# Patient Record
Sex: Male | Born: 1980 | Race: Black or African American | Hispanic: No | Marital: Single | Smoking: Current every day smoker
Health system: Southern US, Community
[De-identification: ages and names within clinical notes are randomized; demographics above are authoritative.]

## PROBLEM LIST (undated history)

## (undated) DIAGNOSIS — N200 Calculus of kidney: Secondary | ICD-10-CM

## (undated) DIAGNOSIS — I10 Essential (primary) hypertension: Secondary | ICD-10-CM

---

## 2008-09-26 ENCOUNTER — Emergency Department: Payer: Self-pay | Admitting: Emergency Medicine

## 2012-03-04 ENCOUNTER — Emergency Department: Payer: Self-pay | Admitting: Emergency Medicine

## 2012-03-04 LAB — COMPREHENSIVE METABOLIC PANEL
Albumin: 4 g/dL (ref 3.4–5.0)
Alkaline Phosphatase: 114 U/L (ref 50–136)
Anion Gap: 11 (ref 7–16)
BUN: 11 mg/dL (ref 7–18)
Calcium, Total: 9.4 mg/dL (ref 8.5–10.1)
Creatinine: 1 mg/dL (ref 0.60–1.30)
Glucose: 115 mg/dL — ABNORMAL HIGH (ref 65–99)
Osmolality: 282 (ref 275–301)
Potassium: 3.9 mmol/L (ref 3.5–5.1)
SGOT(AST): 38 U/L — ABNORMAL HIGH (ref 15–37)
Sodium: 141 mmol/L (ref 136–145)
Total Protein: 7.6 g/dL (ref 6.4–8.2)

## 2012-03-04 LAB — URINALYSIS, COMPLETE
Bilirubin,UR: NEGATIVE
Glucose,UR: NEGATIVE mg/dL (ref 0–75)
Ketone: NEGATIVE
Leukocyte Esterase: NEGATIVE
Protein: 100
RBC,UR: 1055 /HPF (ref 0–5)
Squamous Epithelial: 1
WBC UR: 12 /HPF (ref 0–5)

## 2012-03-04 LAB — CBC
HCT: 41.5 % (ref 40.0–52.0)
HGB: 13.7 g/dL (ref 13.0–18.0)
MCH: 27.8 pg (ref 26.0–34.0)
MCHC: 33 g/dL (ref 32.0–36.0)
Platelet: 224 10*3/uL (ref 150–440)
RDW: 14.7 % — ABNORMAL HIGH (ref 11.5–14.5)

## 2014-12-29 ENCOUNTER — Encounter: Payer: Self-pay | Admitting: Intensive Care

## 2014-12-29 ENCOUNTER — Emergency Department
Admission: EM | Admit: 2014-12-29 | Discharge: 2014-12-29 | Disposition: A | Discharge status: Home / Self Care | Payer: Self-pay | Attending: Emergency Medicine | Admitting: Emergency Medicine

## 2014-12-29 DIAGNOSIS — Z720 Tobacco use: Secondary | ICD-10-CM | POA: Insufficient documentation

## 2014-12-29 DIAGNOSIS — I10 Essential (primary) hypertension: Secondary | ICD-10-CM | POA: Insufficient documentation

## 2014-12-29 DIAGNOSIS — H65191 Other acute nonsuppurative otitis media, right ear: Secondary | ICD-10-CM | POA: Insufficient documentation

## 2014-12-29 DIAGNOSIS — H6121 Impacted cerumen, right ear: Secondary | ICD-10-CM | POA: Insufficient documentation

## 2014-12-29 HISTORY — DX: Essential (primary) hypertension: I10

## 2014-12-29 MED ORDER — IBUPROFEN 800 MG PO TABS
800.0000 mg | ORAL_TABLET | Freq: Three times a day (TID) | ORAL | Status: DC | PRN
Start: 2014-12-29 — End: 2016-03-09

## 2014-12-29 MED ORDER — CARBAMIDE PEROXIDE 6.5 % OT SOLN
5.0000 [drp] | Freq: Once | OTIC | Status: AC
  Administered 2014-12-29: 5 [drp] via OTIC
  Filled 2014-12-29: qty 15

## 2014-12-29 MED ORDER — AZITHROMYCIN 250 MG PO TABS: ORAL_TABLET | ORAL | Status: DC

## 2014-12-29 NOTE — ED Notes (Signed)
Ear  Irrigation performed with warm water and hydrogen peroxide

## 2014-12-29 NOTE — ED Notes (Signed)
Patient presents to ED with c/o R ear pain and throat pain X 1 day. NAD noted. Patient ambuated to rm with NAD

## 2014-12-29 NOTE — ED Provider Notes (Signed)
[facility] Emergency Department Provider Note  ____________________________________________  Time seen: Approximately 9:21 AM  I have reviewed the triage vital signs and the nursing notes.   HISTORY  Chief Complaint Ear Pain    HPI Guy BanJames Shoemaker Jr. is a 34 y.o. male resents for evaluation of right ear pain. Patient states his years been blocked up and he can't hear out of his ear. Denies any problems complaints with his left ear.   Past Medical History  Diagnosis Date  . Hypertension     There are no active problems to display for this patient.   History reviewed. No pertinent past surgical history.  Current Outpatient Rx  Name  Route  Sig  Dispense  Refill  . azithromycin (ZITHROMAX Z-PAK) 250 MG tablet      Take 2 tablets (500 mg) on  Day 1,  followed by 1 tablet (250 mg) once daily on Days 2 through 5.   6 each   0   . ibuprofen (ADVIL,MOTRIN) 800 MG tablet   Oral   Take 1 tablet (800 mg total) by mouth every 8 (eight) hours as needed.   30 tablet   0     Allergies Review of patient's allergies indicates no known allergies.  History reviewed. No pertinent family history.  Social History Social History  Substance Use Topics  . Smoking status: Current Every Day Smoker -- 0.50 packs/day    Types: Cigarettes  . Smokeless tobacco: Never Used  . Alcohol Use: No    Review of Systems Constitutional: No fever/chills Eyes: No visual changes. ENT: No sore throat. Positive right ear pain and decreased hearing. Cardiovascular: Denies chest pain. Respiratory: Denies shortness of breath. Gastrointestinal: No abdominal pain.  No nausea, no vomiting.  No diarrhea.  No constipation. Genitourinary: Negative for dysuria. Musculoskeletal: Negative for back pain. Skin: Negative for rash. Neurological: Negative for headaches, focal weakness or numbness.  10-point ROS otherwise  negative.  ____________________________________________   PHYSICAL EXAM:  VITAL SIGNS: ED Triage Vitals  Enc Vitals Group     BP 12/29/14 0915 172/108 mmHg     Pulse Rate 12/29/14 0915 83     Resp 12/29/14 0915 20     Temp 12/29/14 0915 97.8 F (36.6 C)     Temp Source 12/29/14 0915 Oral     SpO2 12/29/14 0915 94 %     Weight 12/29/14 0915 483 lb (219.087 kg)     Height 12/29/14 0915 5\' 7"  (1.702 m)     Head Cir --      Peak Flow --      Pain Score 12/29/14 0916 5     Pain Loc --      Pain Edu? --      Excl. in GC? --     Constitutional: Alert and oriented. Well appearing and in no acute distress. Eyes: Conjunctivae are normal. PERRL. EOMI. Head: Atraumatic. Right ear with cerumen impaction. Left ear unremarkable with TMs normal. Nose: No congestion/rhinnorhea. Mouth/Throat: Mucous membranes are moist.  Oropharynx non-erythematous. Neck: No stridor.   Cardiovascular: Normal rate, regular rhythm. Grossly normal heart sounds.  Good peripheral circulation. Respiratory: Normal respiratory effort.  No retractions. Lungs CTAB. Gastrointestinal: Soft and nontender. No distention. No abdominal bruits. No CVA tenderness. Musculoskeletal: No lower extremity tenderness nor edema.  No joint effusions. Neurologic:  Normal speech and language. No gross focal neurologic deficits are appreciated. No gait instability. Skin:  Skin is warm, dry and intact. No rash noted. Psychiatric: Mood  and affect are normal. Speech and behavior are normal.  ____________________________________________   LABS (all labs ordered are listed, but only abnormal results are displayed)  Labs Reviewed - No data to display ____________________________________________   PROCEDURES  Procedure(s) performed: None  Critical Care performed: No  ____________________________________________   INITIAL IMPRESSION / ASSESSMENT AND PLAN / ED COURSE  Pertinent labs & imaging results that were available during  my care of the patient were reviewed by me and considered in my medical decision making (see chart for details).  Cerumen impaction right ear. Resolved with irrigation. Acute otitis media right. Rx provided for Z-Pak, Motrin 800 mg 3 times a day. Patient follow-up with PCP or return to the ER with any worsening symptomology. ____________________________________________   FINAL CLINICAL IMPRESSION(S) / ED DIAGNOSES  Final diagnoses:  Other acute nonsuppurative otitis media of right ear  Cerumen impaction, right      Evangeline Dakin, PA-C 12/29/14 1045  Sharyn Creamer, MD 12/29/14 1546

## 2014-12-29 NOTE — Discharge Instructions (Signed)
Otitis Media, Adult °Otitis media is redness, soreness, and inflammation of the middle ear. Otitis media may be caused by allergies or, most commonly, by infection. Often it occurs as a complication of the common cold. °SIGNS AND SYMPTOMS °Symptoms of otitis media may include: °· Earache. °· Fever. °· Ringing in your ear. °· Headache. °· Leakage of fluid from the ear. °DIAGNOSIS °To diagnose otitis media, your health care provider will examine your ear with an otoscope. This is an instrument that allows your health care provider to see into your ear in order to examine your eardrum. Your health care provider also will ask you questions about your symptoms. °TREATMENT  °Typically, otitis media resolves on its own within 3-5 days. Your health care provider may prescribe medicine to ease your symptoms of pain. If otitis media does not resolve within 5 days or is recurrent, your health care provider may prescribe antibiotic medicines if he or she suspects that a bacterial infection is the cause. °HOME CARE INSTRUCTIONS  °· If you were prescribed an antibiotic medicine, finish it all even if you start to feel better. °· Take medicines only as directed by your health care provider. °· Keep all follow-up visits as directed by your health care provider. °SEEK MEDICAL CARE IF: °· You have otitis media only in one ear, or bleeding from your nose, or both. °· You notice a lump on your neck. °· You are not getting better in 3-5 days. °· You feel worse instead of better. °SEEK IMMEDIATE MEDICAL CARE IF:  °· You have pain that is not controlled with medicine. °· You have swelling, redness, or pain around your ear or stiffness in your neck. °· You notice that part of your face is paralyzed. °· You notice that the bone behind your ear (mastoid) is tender when you touch it. °MAKE SURE YOU:  °· Understand these instructions. °· Will watch your condition. °· Will get help right away if you are not doing well or get worse. °  °This  information is not intended to replace advice given to you by your health care provider. Make sure you discuss any questions you have with your health care provider. °  °Document Released: 11/30/2003 Document Revised: 03/17/2014 Document Reviewed: 09/21/2012 °Elsevier Interactive Patient Education ©2016 [employer] ° °Cerumen Impaction °The structures of the external ear canal secrete a waxy substance known as cerumen. Excess cerumen can build up in the ear canal, causing a condition known as cerumen impaction. Cerumen impaction can cause ear pain and disrupt the function of the ear. °The rate of cerumen production differs for each individual. In certain individuals, the configuration of the ear canal may decrease his or her ability to naturally remove cerumen. °CAUSES °Cerumen impaction is caused by excessive cerumen production or buildup. °RISK FACTORS °· Frequent use of swabs to clean ears. °· Having narrow ear canals. °· Having eczema. °· Being dehydrated. °SIGNS AND SYMPTOMS °· Diminished hearing. °· Ear drainage. °· Ear pain. °· Ear itch. °TREATMENT °Treatment may involve: °· Over-the-counter or prescription ear drops to soften the cerumen. °· Removal of cerumen by a health care provider. This may be done with: °¨ Irrigation with warm water. This is the most common method of removal. °¨ Ear curettes and other instruments. °¨ Surgery. This may be done in severe cases. °HOME CARE INSTRUCTIONS °· Take medicines only as directed by your health care provider. °· Do not insert objects into the ear with the intent of cleaning the ear. °PREVENTION °·   Do not insert objects into the ear, even with the intent of cleaning the ear. Removing cerumen as a part of normal hygiene is not necessary, and the use of swabs in the ear canal is not recommended. °· Drink enough water to keep your urine clear or pale yellow. °· Control your eczema if you have it. °SEEK MEDICAL CARE IF: °· You develop ear pain. °· You develop bleeding  from the ear. °· The cerumen does not clear after you use ear drops as directed. °  °This information is not intended to replace advice given to you by your health care provider. Make sure you discuss any questions you have with your health care provider. °  °Document Released: 04/03/2004 Document Revised: 03/17/2014 Document Reviewed: 10/11/2014 °Elsevier Interactive Patient Education ©2016 [employer]vier [employer] ° °

## 2016-03-09 ENCOUNTER — Emergency Department
Admission: EM | Admit: 2016-03-09 | Discharge: 2016-03-09 | Disposition: A | Discharge status: Home / Self Care | Payer: Self-pay | Attending: Emergency Medicine | Admitting: Emergency Medicine

## 2016-03-09 ENCOUNTER — Encounter: Payer: Self-pay | Admitting: Emergency Medicine

## 2016-03-09 DIAGNOSIS — R030 Elevated blood-pressure reading, without diagnosis of hypertension: Secondary | ICD-10-CM

## 2016-03-09 DIAGNOSIS — F17210 Nicotine dependence, cigarettes, uncomplicated: Secondary | ICD-10-CM | POA: Insufficient documentation

## 2016-03-09 DIAGNOSIS — I10 Essential (primary) hypertension: Secondary | ICD-10-CM | POA: Insufficient documentation

## 2016-03-09 DIAGNOSIS — K047 Periapical abscess without sinus: Secondary | ICD-10-CM | POA: Insufficient documentation

## 2016-03-09 MED ORDER — AMOXICILLIN 500 MG PO CAPS: 500.0000 mg | ORAL_CAPSULE | Freq: Three times a day (TID) | ORAL | 0 refills | Status: DC

## 2016-03-09 MED ORDER — IBUPROFEN 600 MG PO TABS: 600.0000 mg | ORAL_TABLET | Freq: Three times a day (TID) | ORAL | 0 refills | Status: DC | PRN

## 2016-03-09 NOTE — ED Notes (Signed)
NAD noted at time of D/C. Pt denies questions or concerns. Pt ambulatory to the lobby at this time.  

## 2016-03-09 NOTE — Discharge Instructions (Signed)
High [facility], or [facility]. Contact dental clinic or [facility] to be seen. Begin taking antibiotics today. Ibuprofen as needed for pain. Soft foods until able to eat without pain.

## 2016-03-09 NOTE — ED Provider Notes (Signed)
[facility] Emergency Department Provider Note  ____________________________________________   First MD Initiated Contact with Patient 03/09/16 0730     (approximate)  I have reviewed the triage vital signs and the nursing notes.   HISTORY  Chief Complaint Dental Pain  HPI Guy BanJames Stamos Jr. is a 35 y.o. male is here complaining of dental pain. Patient states that he is aware that he has some cavities but states that for the last few days he has had more pain and it is worse at night.  He states he has been to [facility] in the past and asked where he plans to go again. He denies any fever or chills.   Past Medical History:  Diagnosis Date  . Hypertension     There are no active problems to display for this patient.   History reviewed. No pertinent surgical history.  Prior to Admission medications   Medication Sig Start Date End Date Taking? Authorizing Provider  amoxicillin (AMOXIL) 500 MG capsule Take 1 capsule (500 mg total) by mouth 3 (three) times daily. 03/09/16   Tommi Rumpshonda L Summers, PA-C  azithromycin (ZITHROMAX Z-PAK) 250 MG tablet Take 2 tablets (500 mg) on  Day 1,  followed by 1 tablet (250 mg) once daily on Days 2 through 5. 12/29/14   Evangeline Dakinharles M Beers, PA-C  ibuprofen (ADVIL,MOTRIN) 600 MG tablet Take 1 tablet (600 mg total) by mouth every 8 (eight) hours as needed. 03/09/16   Tommi Rumpshonda L Summers, PA-C    Allergies Patient has no known allergies.  History reviewed. No pertinent family history.  Social History Social History  Substance Use Topics  . Smoking status: Current Every Day Smoker    Packs/day: 0.50    Types: Cigarettes  . Smokeless tobacco: Never Used  . Alcohol use No    Review of Systems Constitutional: No fever/chills ENT: Positive dental pain. Cardiovascular: Denies chest pain. Respiratory: Denies shortness of breath. Gastrointestinal:   No nausea, no vomiting.  Neurological: Negative for headaches,  focal weakness or numbness.  10-point ROS otherwise negative.  ____________________________________________   PHYSICAL EXAM:  VITAL SIGNS: ED Triage Vitals  Enc Vitals Group     BP 03/09/16 0726 (!) 169/110     Pulse Rate 03/09/16 0726 80     Resp 03/09/16 0726 20     Temp 03/09/16 0726 97.5 F (36.4 C)     Temp Source 03/09/16 0726 Oral     SpO2 03/09/16 0726 97 %     Weight 03/09/16 0725 (!) 480 lb (217.7 kg)     Height 03/09/16 0725 5\' 7"  (1.702 m)     Head Circumference --      Peak Flow --      Pain Score 03/09/16 0719 10     Pain Loc --      Pain Edu? --      Excl. in GC? --     Constitutional: Alert and oriented. Well appearing and in no acute distress. Eyes: Conjunctivae are normal. PERRL. EOMI. Head: Atraumatic. Nose: No congestion/rhinnorhea. Mouth/Throat: Mucous membranes are moist.  Oropharynx non-erythematous. Right upper molars are in poor repair and hygiene. 2 molars are down below the gumline. There is no active drainage noted at this time. There is tenderness on palpation of the gum area with a tongue depressor. Neck: No stridor.   Hematological/Lymphatic/Immunilogical: No cervical lymphadenopathy. Cardiovascular: Normal rate, regular rhythm. Grossly normal heart sounds.  Good peripheral circulation. Respiratory: Normal respiratory effort.  No retractions.  Lungs CTAB. Musculoskeletal: Moves upper and lower extremities without any difficulty. Normal gait was noted. Neurologic:  Normal speech and language. No gross focal neurologic deficits are appreciated. No gait instability. Skin:  Skin is warm, dry and intact. No rash noted. Psychiatric: Mood and affect are normal. Speech and behavior are normal.  ____________________________________________   LABS (all labs ordered are listed, but only abnormal results are displayed)  Labs Reviewed - No data to display  PROCEDURES  Procedure(s) performed: None  Procedures  Critical Care performed:  No  ____________________________________________   INITIAL IMPRESSION / ASSESSMENT AND PLAN / ED COURSE  Pertinent labs & imaging results that were available during my care of the patient were reviewed by me and considered in my medical decision making (see chart for details).    Clinical Course    Patient was started on amoxicillin 500 mg 3 times a day for 10 days and ibuprofen as needed for pain and inflammation. He is to contact the dental clinic [facility]. He also was encouraged to have his blood pressure recheck[facility] at [facility]. He states that he is not on blood pressure medication and has not been diagnosed with hypertension although it runs in his family. Patient was made aware of his blood pressure day.  ____________________________________________   FINAL CLINICAL IMPRESSION(S) / ED DIAGNOSES  Final diagnoses:  Dental abscess  Elevated blood-pressure reading without diagnosis of hypertension      NEW MEDICATIONS STARTED DURING THIS VISIT:  Discharge Medication List as of 03/09/2016  8:04 AM    START taking these medications   Details  amoxicillin (AMOXIL) 500 MG capsule Take 1 capsule (500 mg total) by mouth 3 (three) times daily., Starting Sun 03/09/2016, Print         Note:  This document was prepared using Dragon voice recognition software and may include unintentional dictation errors.    Tommi Rumpshonda L Summers, PA-C 03/09/16 1554    Jene Everyobert Kinner, MD 03/11/16 [phone]

## 2016-03-09 NOTE — ED Triage Notes (Signed)
Toothache for the past few days, worse at night when resting, no pain during the day when working.

## 2016-12-21 ENCOUNTER — Emergency Department
Admission: EM | Admit: 2016-12-21 | Discharge: 2016-12-21 | Disposition: A | Discharge status: Home / Self Care | Payer: Self-pay | Attending: Emergency Medicine | Admitting: Emergency Medicine

## 2016-12-21 ENCOUNTER — Encounter: Payer: Self-pay | Admitting: Emergency Medicine

## 2016-12-21 DIAGNOSIS — I10 Essential (primary) hypertension: Secondary | ICD-10-CM | POA: Insufficient documentation

## 2016-12-21 DIAGNOSIS — F17210 Nicotine dependence, cigarettes, uncomplicated: Secondary | ICD-10-CM | POA: Insufficient documentation

## 2016-12-21 DIAGNOSIS — L600 Ingrowing nail: Secondary | ICD-10-CM | POA: Insufficient documentation

## 2016-12-21 MED ORDER — CEPHALEXIN 500 MG PO CAPS: 500.0000 mg | ORAL_CAPSULE | Freq: Three times a day (TID) | ORAL | 0 refills | Status: DC

## 2016-12-21 NOTE — ED Notes (Signed)
Pt verbalizes understanding of d/c instructions, medications and follow up 

## 2016-12-21 NOTE — Discharge Instructions (Signed)
Soak the foot in warm water with Epsom salts at least twice a day. Take the antibiotic for infection. Wear looser shoes. Do not cut the skin at the nail. Follow up with the [facility]. They can refer you to a foot specialist if needed

## 2016-12-21 NOTE — ED Triage Notes (Signed)
Pt here with c/o left great toe pain, states he pulled out ingrown toenail 3 days ago, infection to sight now.

## 2016-12-21 NOTE — ED Provider Notes (Signed)
[facility] Emergency Department Provider Note  ____________________________________________   None    (approximate)  I have reviewed the triage vital signs and the nursing notes.   HISTORY  Chief Complaint Toe Pain    HPI Guy Ryan. is a 36 y.o. male c/o of left great toe pain.states he cut his toenail too short and the area became red and swollen. Did soak it in some warm water. Cut worse after he worked all day with tight shoes on. denies fever chills   Past Medical History:  Diagnosis Date  . Hypertension     There are no active problems to display for this patient.   History reviewed. No pertinent surgical history.  Prior to Admission medications   Medication Sig Start Date End Date Taking? Authorizing Provider  cephALEXin (KEFLEX) 500 MG capsule Take 1 capsule (500 mg total) by mouth 3 (three) times daily. 12/21/16   Faythe Ghee, PA-C    Allergies Percocet [oxycodone-acetaminophen]  No family history on file.  Social History Social History  Substance Use Topics  . Smoking status: Current Every Day Smoker    Packs/day: 0.50    Types: Cigarettes  . Smokeless tobacco: Never Used  . Alcohol use No    Review of Systems  Constitutional: No fever/chills Eyes: No visual changes. ENT: No sore throat. Respiratory: Denies cough Genitourinary: Negative for dysuria. Musculoskeletal: Negative for back pain. Skin: positive for redness and swelling.    ____________________________________________   PHYSICAL EXAM:  VITAL SIGNS: ED Triage Vitals  Enc Vitals Group     BP 12/21/16 0743 (!) 180/97     Pulse Rate 12/21/16 0743 98     Resp 12/21/16 0743 (!) 22     Temp 12/21/16 0743 98.3 F (36.8 C)     Temp Source 12/21/16 0743 Oral     SpO2 12/21/16 0743 99 %     Weight 12/21/16 0744 (!) 485 lb (220 kg)     Height 12/21/16 0744  (1.702 m)     Head Circumference --      Peak Flow --      Pain Score 12/21/16  0832 5     Pain Loc --      Pain Edu? --      Excl. in GC? --     Constitutional: Alert and oriented. Well appearing and in no acute distress. Eyes: Conjunctivae are normal.  Head: Atraumatic. Nose: No congestion/rhinnorhea. Mouth/Throat: Mucous membranes are moist.   Cardiovascular: Normal rate, regular rhythm. Respiratory: Normal respiratory effort.  No retractions GU: deferred Musculoskeletal: FROM all extremities, warm and well perfused. Left great toe is negative for bony tenderness Neurologic:  Normal speech and language.  Skin:  Skin is warm, dry and intact. Redness noted at the area along the cuticle and the medial side of the left great toe. Psychiatric: Mood and affect are normal. Speech and behavior are normal.  ____________________________________________   LABS (all labs ordered are listed, but only abnormal results are displayed)  Labs Reviewed - No data to display ____________________________________________   ____________________________________________  RADIOLOGY  none  ____________________________________________   PROCEDURES  Procedure(s) performed: none      ____________________________________________   INITIAL IMPRESSION / ASSESSMENT AND PLAN / ED COURSE  Pertinent labs & imaging results that were available during my care of the patient were reviewed by me and considered in my medical decision making (see chart for details).  [name] 36 year old male with no history of diabetes. Positive  for an infected ingrown toenail. Antibiotics given for the infection. Patient is to soak the foot at least 3 times a day. He is to follow-up with his regular doctor at [facility] who can refer him to a foot doctor if needed.      ____________________________________________   FINAL CLINICAL IMPRESSION(S) / ED DIAGNOSES  Final diagnoses:  Ingrown toenail of left foot      NEW MEDICATIONS STARTED DURING THIS VISIT:  Discharge  Medication List as of 12/21/2016  8:41 AM    START taking these medications   Details  cephALEXin (KEFLEX) 500 MG capsule Take 1 capsule (500 mg total) by mouth 3 (three) times daily., Starting Sun 12/21/2016, Print         Note:  This document was prepared using Dragon voice recognition software and may include unintentional dictation errors.    Faythe Ghee, PA-C 12/21/16 1114    Jene Every, MD 12/21/16 [phone]

## 2017-10-13 ENCOUNTER — Emergency Department: Payer: Self-pay

## 2017-10-13 ENCOUNTER — Encounter: Payer: Self-pay | Admitting: Emergency Medicine

## 2017-10-13 ENCOUNTER — Emergency Department
Admission: EM | Admit: 2017-10-13 | Discharge: 2017-10-13 | Disposition: A | Discharge status: Home / Self Care | Payer: Self-pay | Attending: Emergency Medicine | Admitting: Emergency Medicine

## 2017-10-13 ENCOUNTER — Other Ambulatory Visit: Payer: Self-pay

## 2017-10-13 DIAGNOSIS — I10 Essential (primary) hypertension: Secondary | ICD-10-CM | POA: Insufficient documentation

## 2017-10-13 DIAGNOSIS — M5440 Lumbago with sciatica, unspecified side: Secondary | ICD-10-CM | POA: Insufficient documentation

## 2017-10-13 DIAGNOSIS — M5442 Lumbago with sciatica, left side: Secondary | ICD-10-CM

## 2017-10-13 DIAGNOSIS — F17210 Nicotine dependence, cigarettes, uncomplicated: Secondary | ICD-10-CM | POA: Insufficient documentation

## 2017-10-13 DIAGNOSIS — M25552 Pain in left hip: Secondary | ICD-10-CM | POA: Insufficient documentation

## 2017-10-13 MED ORDER — BACLOFEN 10 MG PO TABS: 10.0000 mg | ORAL_TABLET | Freq: Every day | ORAL | 1 refills | Status: AC

## 2017-10-13 MED ORDER — MELOXICAM 15 MG PO TABS: 15.0000 mg | ORAL_TABLET | Freq: Every day | ORAL | 2 refills | Status: AC

## 2017-10-13 NOTE — ED Provider Notes (Signed)
[facility] Emergency Department Provider Note  ____________________________________________   First MD Initiated Contact with Patient 10/13/17 1049     (approximate)  I have reviewed the triage vital signs and the nursing notes.   HISTORY  Chief Complaint Hip Pain    HPI Guy BanJames Canavan Jr. is a 37 y.o. male presents emergency department complaining of left hip pain which radiates to the knee.  He states it starts in the lower back and wraps around to the hip and radiates down.  He denies any injury.  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  He denies any other health problems.    Past Medical History:  Diagnosis Date  . Hypertension     There are no active problems to display for this patient.   History reviewed. No pertinent surgical history.  Prior to Admission medications   Medication Sig Start Date End Date Taking? Authorizing Provider  baclofen (LIORESAL) 10 MG tablet Take 1 tablet (10 mg total) by mouth daily. 10/13/17 10/13/18  Sherrie MustacheFisher, Roselyn BeringSusan W, PA-C  meloxicam (MOBIC) 15 MG tablet Take 1 tablet (15 mg total) by mouth daily. 10/13/17 10/13/18  Faythe GheeFisher, Shakil Dirk W, PA-C    Allergies Percocet [oxycodone-acetaminophen]  No family history on file.  Social History Social History   Tobacco Use  . Smoking status: Current Every Day Smoker    Packs/day: 0.50    Types: Cigarettes  . Smokeless tobacco: Never Used  Substance Use Topics  . Alcohol use: No  . Drug use: No    Review of Systems  Constitutional: No fever/chills Eyes: No visual changes. ENT: No sore throat. Respiratory: Denies cough Genitourinary: Negative for dysuria. Musculoskeletal: Positive for left hip and for back pain.  Which radiates to the left leg Skin: Negative for rash.    ____________________________________________   PHYSICAL EXAM:  VITAL SIGNS: ED Triage Vitals  Enc Vitals Group     BP 10/13/17 1036 (!) 165/85     Pulse Rate 10/13/17 1036 64     Resp --    Temp 10/13/17 1036 97.9 F (36.6 C)     Temp Source 10/13/17 1036 Oral     SpO2 10/13/17 1036 100 %     Weight 10/13/17 1037 (!) 471 lb (213.6 kg)     Height 10/13/17 1037 5\' 8"  (1.727 m)     Head Circumference --      Peak Flow --      Pain Score 10/13/17 1036 5     Pain Loc --      Pain Edu? --      Excl. in [place]? --     Constitutional: Alert and oriented. Well appearing and in no acute distress.  Patient is morbidly obese and weighs 471 pounds Eyes: Conjunctivae are normal.  Head: Atraumatic. Nose: No congestion/rhinnorhea. Mouth/Throat: Mucous membranes are moist.   Neck:  supple no lymphadenopathy noted Cardiovascular: Normal rate, regular rhythm. Heart sounds are normal Respiratory: Normal respiratory effort.  No retractions, lungs c t a  GU: deferred Musculoskeletal: FROM all extremities, warm and well perfused.  Patient is able to bear weight.  Lumbar spine is minimally tender but the SI joint is tender.  Left hip is mildly tender.  Neurovascular is intact Neurologic:  Normal speech and language.  Skin:  Skin is warm, dry and intact. No rash noted. Psychiatric: Mood and affect are normal. Speech and behavior are normal.  ____________________________________________   LABS (all labs ordered are listed, but only abnormal results are  displayed)  Labs Reviewed - No data to display ____________________________________________   ____________________________________________  RADIOLOGY  X-ray lumbar spine and left hip are both negative for any acute abnormalities  ____________________________________________   PROCEDURES  Procedure(s) performed: No  Procedures    ____________________________________________   INITIAL IMPRESSION / ASSESSMENT AND PLAN / ED COURSE  Pertinent labs & imaging results that were available during my care of the patient were reviewed by me and considered in my medical decision making (see chart for details).   Patient is 37 year old  male presents emergency department complaining of low back pain that wraps through the hip and down the left leg.  He denies any injury.  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  He is neurovascularly intact.  Again he is able to bear weight.  Patient is morbidly obese as he weighs 471 pounds.  X-ray lumbar spine and left hip are negative for any acute abnormalities.  Explained findings to the patient.  Had a very lengthy discussion with him concerning his weight.  Explained to him that back pain and hip pain along with knee pain will only get worse due to his weight.  Recommended a low calorie diet and exercise.  He should follow-up with his regular doctor concerning this and for the low back pain.  He was given a prescription for meloxicam and baclofen.  He is to apply ice to the area.  Return if worsening.  Follow-up with orthopedics if not improving in 5 to 7 days.  He was discharged in stable condition.     As part of my medical decision making, I reviewed the following data within the electronic MEDICAL RECORD NUMBER Nursing notes reviewed and incorporated, Old chart reviewed, Radiograph reviewed x-ray lumbar spine and left hip are negative, Notes from prior ED visits and [place] Controlled Substance Database  ____________________________________________   FINAL CLINICAL IMPRESSION(S) / ED DIAGNOSES  Final diagnoses:  Left hip pain  Acute left-sided back pain with sciatica      NEW MEDICATIONS STARTED DURING THIS VISIT:  Discharge Medication List as of 10/13/2017 12:06 PM    START taking these medications   Details  baclofen (LIORESAL) 10 MG tablet Take 1 tablet (10 mg total) by mouth daily., Starting Tue 10/13/2017, Until Wed 10/13/2018, Normal    meloxicam (MOBIC) 15 MG tablet Take 1 tablet (15 mg total) by mouth daily., Starting Tue 10/13/2017, Until Wed 10/13/2018, Normal         Note:  This document was prepared  using Dragon voice recognition software and may include unintentional dictation errors.    Faythe Ghee, PA-C 10/13/17 1448    Jene Every, MD 10/13/17 1452

## 2017-10-13 NOTE — ED Notes (Signed)
See triage note  Presents with left hip pain which radiates into knee    States pain started on Sunday w/o known injury

## 2017-10-13 NOTE — ED Triage Notes (Signed)
Patient complaining of left pain "shooting pains to the knee", worse with weight-bearing or lying down.  Eased by sitting.  States he has had bilateral hip pain off and on for approx. 18 mos.  Denies hx of injury.

## 2017-10-13 NOTE — ED Notes (Addendum)
First Nurse Note: Patient placed in bariatric WC with complaint of left hip pain.

## 2017-10-13 NOTE — Discharge Instructions (Addendum)
Follow-up with your regular doctor if not better in 3 to 5 days.  Return emergency department worsening.  Take medication as prescribed.  Try to lose weight by eating healthy and exercising.  Instructions have been given to you on how to follow a diet.  Also consider downloading the my fit pal plan on your phone.  This would help you know how much she should eat in the day.

## 2018-11-03 ENCOUNTER — Other Ambulatory Visit: Payer: Self-pay

## 2018-11-03 ENCOUNTER — Emergency Department
Admission: EM | Admit: 2018-11-03 | Discharge: 2018-11-03 | Disposition: A | Discharge status: Home / Self Care | Payer: Self-pay | Attending: Emergency Medicine | Admitting: Emergency Medicine

## 2018-11-03 DIAGNOSIS — F17210 Nicotine dependence, cigarettes, uncomplicated: Secondary | ICD-10-CM | POA: Insufficient documentation

## 2018-11-03 DIAGNOSIS — K0889 Other specified disorders of teeth and supporting structures: Secondary | ICD-10-CM | POA: Insufficient documentation

## 2018-11-03 DIAGNOSIS — Y929 Unspecified place or not applicable: Secondary | ICD-10-CM | POA: Insufficient documentation

## 2018-11-03 DIAGNOSIS — Y998 Other external cause status: Secondary | ICD-10-CM | POA: Insufficient documentation

## 2018-11-03 DIAGNOSIS — Y939 Activity, unspecified: Secondary | ICD-10-CM | POA: Insufficient documentation

## 2018-11-03 DIAGNOSIS — S025XXA Fracture of tooth (traumatic), initial encounter for closed fracture: Secondary | ICD-10-CM | POA: Insufficient documentation

## 2018-11-03 DIAGNOSIS — I10 Essential (primary) hypertension: Secondary | ICD-10-CM | POA: Insufficient documentation

## 2018-11-03 DIAGNOSIS — X58XXXA Exposure to other specified factors, initial encounter: Secondary | ICD-10-CM | POA: Insufficient documentation

## 2018-11-03 MED ORDER — KETOROLAC TROMETHAMINE 10 MG PO TABS
10.0000 mg | ORAL_TABLET | Freq: Once | ORAL | Status: AC
  Administered 2018-11-03: 10 mg via ORAL
  Filled 2018-11-03: qty 1

## 2018-11-03 MED ORDER — CHLORHEXIDINE GLUCONATE 0.12 % MT SOLN: 15.0000 mL | Freq: Two times a day (BID) | OROMUCOSAL | 0 refills | Status: DC

## 2018-11-03 MED ORDER — KETOROLAC TROMETHAMINE 10 MG PO TABS: 10.0000 mg | ORAL_TABLET | Freq: Four times a day (QID) | ORAL | 0 refills | Status: DC | PRN

## 2018-11-03 NOTE — ED Provider Notes (Signed)
[facility] Emergency Department Provider Note  ____________________________________________  Time seen: Approximately 6:57 AM  I have reviewed the triage vital signs and the nursing notes.   HISTORY  Chief Complaint Dental Pain    HPI Guy Ryan. is a 38 y.o. male with a history of hypertension who comes the ED complaining of right upper jaw pain from a broken tooth for the past week.  No fevers or chills or swelling.  No difficulty swallowing or breathing.  Pain is constant, waxing waning, worse with biting and chewing on that side.  No alleviating factors.      Past Medical History:  Diagnosis Date  . Hypertension      There are no active problems to display for this patient.    No past surgical history on file.   Prior to Admission medications   Medication Sig Start Date End Date Taking? Authorizing Provider  ketorolac (TORADOL) 10 MG tablet Take 1 tablet (10 mg total) by mouth every 6 (six) hours as needed for moderate pain. 11/03/18   Carrie Mew, MD     Allergies Percocet [oxycodone-acetaminophen]   No family history on file.  Social History Social History   Tobacco Use  . Smoking status: Current Every Day Smoker    Packs/day: 0.50    Types: Cigarettes  . Smokeless tobacco: Never Used  Substance Use Topics  . Alcohol use: No  . Drug use: No    Review of Systems  Constitutional:   No fever or chills.  ENT:   No sore throat. No rhinorrhea.  Dental pain as above Cardiovascular:   No chest pain or syncope. Respiratory:   No dyspnea or cough. Gastrointestinal:   Negative for abdominal pain, vomiting and diarrhea.  Musculoskeletal:   Negative for focal pain or swelling All other systems reviewed and are negative except as documented above in ROS and HPI.  ____________________________________________   PHYSICAL EXAM:  VITAL SIGNS: ED Triage Vitals [11/03/18 0122]  Enc Vitals Group     BP (!) 184/91     Pulse  Rate 86     Resp 20     Temp 98.5 F (36.9 C)     Temp Source Oral     SpO2 100 %     Weight (!) 469 lb (212.7 kg)     Height 5\' 7"  (1.702 m)     Head Circumference      Peak Flow      Pain Score 4     Pain Loc      Pain Edu?      Excl. in [place]?     Vital signs reviewed, nursing assessments reviewed.   Constitutional:   Alert and oriented. Non-toxic appearance. Eyes:   Conjunctivae are normal. EOMI. ENT      Head:   Normocephalic and atraumatic.      Mouth/Throat:   MMM.  Poor dentition with multiple decayed or missing teeth.  Right upper jaw has a molar that is broken with a small pillar of tooth arising from an otherwise decayed stump.  No gingival swelling or abscess.  Floor mouth is soft and not elevated.      Neck:   No meningismus. Full ROM. Hematological/Lymphatic/Immunilogical:   No cervical lymphadenopathy. Cardiovascular:   RRR. Symmetric bilateral radial and DP pulses.  No murmurs. Cap refill less than 2 seconds. Respiratory:   Normal respiratory effort without tachypnea/retractions. Breath sounds are clear and equal bilaterally. No wheezes/rales/rhonchi.  Musculoskeletal:   Normal  range of motion in all extremities.  No edema. Neurologic:   Normal speech and language.  Motor grossly intact. No acute focal neurologic deficits are appreciated.  Skin:    Skin is warm, dry and intact. No rash noted.  No wounds.  ____________________________________________    LABS (pertinent positives/negatives) (all labs ordered are listed, but only abnormal results are displayed) Labs Reviewed - No data to display ____________________________________________   EKG  ____________________________________________    RADIOLOGY  No results found.  ____________________________________________   PROCEDURES Procedures  ____________________________________________  CLINICAL IMPRESSION / ASSESSMENT AND PLAN / ED COURSE  Pertinent labs & imaging results that were available  during my care of the patient were reviewed by me and considered in my medical decision making (see chart for details).  Guy BanJames Markuson Jr. was evaluated in Emergency Department on 11/03/2018 for the symptoms described in the history of present illness. He was evaluated in the context of the global COVID-19 pandemic, which necessitated consideration that the patient might be at risk for infection with the SARS-CoV-2 virus that causes COVID-19. Institutional protocols and algorithms that pertain to the evaluation of patients at risk for COVID-19 are in a state of rapid change based on information released by regulatory bodies including the CDC and federal and state organizations. These policies and algorithms were followed during the patient's care in the ED.   Patient presents with dental pain.  No evidence of abscess or airway threat.  No secondary space infection.  NSAIDs, Peridex for his irritated buccal mucosa, follow-up with dentistry.      ____________________________________________   FINAL CLINICAL IMPRESSION(S) / ED DIAGNOSES    Final diagnoses:  Closed fracture of tooth, initial encounter  Pain, dental     ED Discharge Orders         Ordered    ketorolac (TORADOL) 10 MG tablet  Every 6 hours PRN     11/03/18 0656          Portions of this note were generated with dragon dictation software. Dictation errors may occur despite best attempts at proofreading.   Sharman CheekStafford, Malikah Lakey, MD 11/03/18 [phone]

## 2018-11-03 NOTE — ED Triage Notes (Signed)
Pt in with co toothache for a week, has not tried to see dentist.

## 2019-09-09 ENCOUNTER — Ambulatory Visit: Payer: Self-pay | Attending: Oncology

## 2019-09-09 ENCOUNTER — Other Ambulatory Visit: Payer: Self-pay

## 2019-09-09 DIAGNOSIS — Z23 Encounter for immunization: Secondary | ICD-10-CM

## 2019-09-09 NOTE — Progress Notes (Signed)
   [id number]  Name:  Guy Ryan.    MRN: [id number] DOB: [birth year]  09/09/2019  Mr. Guy Ryan was observed post Covid-19 immunization for 15 minutes without incident. He was provided with Vaccine Information Sheet and instruction to access the V-Safe system.   Mr. Guy Ryan was instructed to call 911 with any severe reactions post vaccine: Marland Kitchen Difficulty breathing  . Swelling of face and throat  . A fast heartbeat  . A bad rash all over body  . Dizziness and weakness   Immunizations Administered    Name Date Dose VIS Date Route   Pfizer COVID-19 Vaccine 09/09/2019  9:56 AM 0.3 mL 05/04/2018 Intramuscular   Manufacturer: [employer], [employer]   Lot: [id number]   NDC: [address]-6

## 2019-09-13 ENCOUNTER — Ambulatory Visit: Payer: Self-pay

## 2019-09-30 ENCOUNTER — Ambulatory Visit: Payer: Self-pay | Attending: Internal Medicine

## 2019-09-30 DIAGNOSIS — Z23 Encounter for immunization: Secondary | ICD-10-CM

## 2019-09-30 NOTE — Progress Notes (Signed)
   [id number]  Name:  Guy Ryan.    MRN: [id number] DOB: 07-Jul-1980  09/30/2019  Mr. Guy Ryan was observed post Covid-19 immunization for 15 minutes without incident. He was provided with Vaccine Information Sheet and instruction to access the V-Safe system.   Mr. Guy Ryan was instructed to call 911 with any severe reactions post vaccine: Marland Kitchen Difficulty breathing  . Swelling of face and throat  . A fast heartbeat  . A bad rash all over body  . Dizziness and weakness   Immunizations Administered    Name Date Dose VIS Date Route   Pfizer COVID-19 Vaccine 09/30/2019  9:09 AM 0.3 mL 05/04/2018 Intramuscular   Manufacturer: [employer], [employer]   Lot: [id number]   NDC: [address]-9

## 2020-02-26 ENCOUNTER — Other Ambulatory Visit: Payer: Self-pay

## 2020-02-26 DIAGNOSIS — M5412 Radiculopathy, cervical region: Secondary | ICD-10-CM | POA: Insufficient documentation

## 2020-02-26 DIAGNOSIS — Z79899 Other long term (current) drug therapy: Secondary | ICD-10-CM | POA: Insufficient documentation

## 2020-02-26 DIAGNOSIS — R200 Anesthesia of skin: Secondary | ICD-10-CM | POA: Insufficient documentation

## 2020-02-26 DIAGNOSIS — I10 Essential (primary) hypertension: Secondary | ICD-10-CM | POA: Insufficient documentation

## 2020-02-26 DIAGNOSIS — F17210 Nicotine dependence, cigarettes, uncomplicated: Secondary | ICD-10-CM | POA: Insufficient documentation

## 2020-02-27 ENCOUNTER — Emergency Department
Admission: EM | Admit: 2020-02-27 | Discharge: 2020-02-27 | Disposition: A | Discharge status: Home / Self Care | Payer: Self-pay | Attending: Emergency Medicine | Admitting: Emergency Medicine

## 2020-02-27 ENCOUNTER — Encounter: Payer: Self-pay | Admitting: Emergency Medicine

## 2020-02-27 ENCOUNTER — Emergency Department: Payer: Self-pay

## 2020-02-27 DIAGNOSIS — M5412 Radiculopathy, cervical region: Secondary | ICD-10-CM

## 2020-02-27 MED ORDER — NAPROXEN 500 MG PO TABS: 500.0000 mg | ORAL_TABLET | Freq: Two times a day (BID) | ORAL | 0 refills | Status: DC

## 2020-02-27 MED ORDER — METHYLPREDNISOLONE 4 MG PO TBPK: ORAL_TABLET | ORAL | 0 refills | Status: DC

## 2020-02-27 NOTE — ED Provider Notes (Signed)
[facility] Emergency Department Provider Note   ____________________________________________   Event Date/Time   First MD Initiated Contact with Patient 02/27/20 0038     (approximate)  I have reviewed the triage vital signs and the nursing notes.   HISTORY  Chief Complaint Neck Pain    HPI Guy Ryan. is a 39 y.o. male who presents to the ED from home with a chief complaint of numbness.  Patient reports he had a "crick" to the left side of his neck 1 month ago which lasted 4 days.  Denies fall/trauma/injury.  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  Since then the pain has subsided but he has numbness where the pain was to the left neck, front of the shoulder and pectoralis.  Nothing is acutely worse; he decided he should seek evaluation for it tonight.  Denies extremity weakness, numbness or tingling.  Denies fever, cough, chest pain, shortness of breath, abdominal pain, nausea, vomiting, headache, vision changes, dizziness, slurred speech or altered mentation.     Past Medical History:  Diagnosis Date  . Hypertension     There are no problems to display for this patient.   History reviewed. No pertinent surgical history.  Prior to Admission medications   Medication Sig Start Date End Date Taking? Authorizing Provider  chlorhexidine (PERIDEX) 0.12 % solution Use as directed 15 mLs in the mouth or throat 2 (two) times daily. 11/03/18   Sharman Cheek, MD  ketorolac (TORADOL) 10 MG tablet Take 1 tablet (10 mg total) by mouth every 6 (six) hours as needed for moderate pain. 11/03/18   Sharman Cheek, MD  methylPREDNISolone (MEDROL DOSEPAK) 4 MG TBPK tablet Take as directed 02/27/20   Irean Hong, MD  naproxen (NAPROSYN) 500 MG tablet Take 1 tablet (500 mg total) by mouth 2 (two) times daily with a meal. 02/27/20   Irean Hong, MD    Allergies Percocet [oxycodone-acetaminophen]  History reviewed. No pertinent family  history.  Social History Social History   Tobacco Use  . Smoking status: Current Every Day Smoker    Packs/day: 0.50    Types: Cigarettes  . Smokeless tobacco: Never Used  Substance Use Topics  . Alcohol use: No  . Drug use: No    Review of Systems  Constitutional: No fever/chills Eyes: No visual changes. ENT: No sore throat. Cardiovascular: Denies chest pain. Respiratory: Denies shortness of breath. Gastrointestinal: No abdominal pain.  No nausea, no vomiting.  No diarrhea.  No constipation. Genitourinary: Negative for dysuria. Musculoskeletal: Negative for back pain. Skin: Negative for rash. Neurological: Positive for left neck/anterior shoulder/pectoralis numbness.  Negative for headaches or focal weakness.   ____________________________________________   PHYSICAL EXAM:  VITAL SIGNS: ED Triage Vitals  Enc Vitals Group     BP 02/27/20 0001 (!) 182/95     Pulse Rate 02/27/20 0001 86     Resp 02/27/20 0001 20     Temp 02/27/20 0001 98.1 F (36.7 C)     Temp Source 02/27/20 0001 Oral     SpO2 02/27/20 0001 100 %     Weight 02/27/20 0002 (!) 445 lb (201.9 kg)     Height --      Head Circumference --      Peak Flow --      Pain Score --      Pain Loc --      Pain Edu? --      Excl. in [place]? --  Constitutional: Alert and oriented. Well appearing and in no acute distress. Eyes: Conjunctivae are normal. PERRL. EOMI. Head: Atraumatic. Nose: No congestion/rhinnorhea. Mouth/Throat: Mucous membranes are moist.   Neck: No stridor.  No cervical spine tenderness to palpation.  No carotid bruits.  Supple neck without meningismus or pain on movement/palpation.  Full range of motion. Cardiovascular: Normal rate, regular rhythm. Grossly normal heart sounds.  Good peripheral circulation. Respiratory: Normal respiratory effort.  No retractions. Lungs CTAB. Gastrointestinal: Soft and nontender. No distention. No abdominal bruits. No CVA tenderness. Musculoskeletal: No  lower extremity tenderness nor edema.  No joint effusions. Neurologic: Oriented x3.  CN II to XII grossly normal.  Normal speech and language. No gross focal neurologic deficits are appreciated.  5/5 motor strength all extremities.  Minimally decreased touch sensation to anterior shoulder/pectoralis muscle in C5/T1 distribution. No gait instability. Skin:  Skin is warm, dry and intact. No rash noted. Psychiatric: Mood and affect are normal. Speech and behavior are normal.  ____________________________________________   LABS (all labs ordered are listed, but only abnormal results are displayed)  Labs Reviewed - No data to display ____________________________________________  EKG  None ____________________________________________  RADIOLOGY I, Elizardo Chilson J, personally viewed and evaluated these images (plain radiographs) as part of my medical decision making, as well as reviewing the written report by the radiologist.  ED MD interpretation: Mild spondylitic changes in the cervical spine  Official radiology report(s): DG Cervical Spine Complete  Result Date: 02/27/2020 CLINICAL DATA:  Left neck pain for 1 month, initially resolved but has since returned with numbness with pressure to the right shoulder. EXAM: CERVICAL SPINE - COMPLETE 4+ VIEW COMPARISON:  None. FINDINGS: The dens is intact. No evidence of traumatic listhesis. No abnormally widened, perched or jumped facets. Normal alignment of the craniocervical and atlantoaxial articulations. C7 vertebrae is poorly visualized due to the superimposed shoulder soft tissues despite the use of multiple swimmer's views. No gross abnormality of this level is seen though incompletely assessed. No visible acute fracture or vertebral body height loss in the cervical spine. Mild spondylitic changes. Some mild uncinate spurring and facet degenerative changes are present C3-4, C4-5. No severe bony foraminal or canal stenosis at the imaged levels.  Enthesopathic changes are noted along the tip of the C6 spinous process. No worrisome osseous lesion. No prevertebral swelling or gas. No acute abnormality in the upper chest or imaged lung apices. IMPRESSION: 1. No acute fracture or traumatic malalignment. 2. Mild spondylitic changes in the cervical spine. 3. C7 vertebrae is poorly visualized due to the superimposed shoulder soft tissues. Electronically Signed   By: Kreg Shropshire M.D.   On: 02/27/2020 01:46    ____________________________________________   PROCEDURES  Procedure(s) performed (including Critical Care):  Procedures   ____________________________________________   INITIAL IMPRESSION / ASSESSMENT AND PLAN / ED COURSE  As part of my medical decision making, I reviewed the following data within the electronic MEDICAL RECORD NUMBER Nursing notes reviewed and incorporated, Old chart reviewed, Radiograph reviewed and Notes from prior ED visits     39 year old male presenting with symptoms suggestive of cervical radiculopathy without extremity weakness. Will obtain plain film cervical spine x-rays.  Clinical Course as of 02/27/20 0432  Mon Feb 27, 2020  0245 Delay secondary to other critical care patients.  Updated patient on x-ray results.  Will discharge home on Prednisone, Naprosyn and refer patient to orthopedics for outpatient follow-up.  Strict return precautions given.  Patient verbalizes understanding agrees with plan of care. [JS]  Clinical Course User Index [JS] Irean Hong, MD     ____________________________________________   FINAL CLINICAL IMPRESSION(S) / ED DIAGNOSES  Final diagnoses:  Cervical radiculopathy     ED Discharge Orders         Ordered    methylPREDNISolone (MEDROL DOSEPAK) 4 MG TBPK tablet        02/27/20 0247    naproxen (NAPROSYN) 500 MG tablet  2 times daily with meals        02/27/20 0247          *Please note:  Sherlie Ban. was evaluated in Emergency Department on 02/27/2020  for the symptoms described in the history of present illness. He was evaluated in the context of the global COVID-19 pandemic, which necessitated consideration that the patient might be at risk for infection with the SARS-CoV-2 virus that causes COVID-19. Institutional protocols and algorithms that pertain to the evaluation of patients at risk for COVID-19 are in a state of rapid change based on information released by regulatory bodies including the CDC and federal and state organizations. These policies and algorithms were followed during the patient's care in the ED.  Some ED evaluations and interventions may be delayed as a result of limited staffing during and the pandemic.*   Note:  This document was prepared using Dragon voice recognition software and may include unintentional dictation errors.   Irean Hong, MD 02/27/20 [phone]

## 2020-02-27 NOTE — Discharge Instructions (Addendum)
Take medicines as prescribed (Medrol Dosepak, Naprosyn). 2.  Return to the ER for worsening symptoms, left arm weakness, persistent vomiting, difficulty breathing or other concerns.

## 2020-02-27 NOTE — ED Triage Notes (Signed)
Pt reports he had a "crick" in the left side of neck 1 month ago that went away after 1 week but since has had numbness when he presses the front of the shoulder. Pt denies chest pain, stiff neck or SOB.

## 2020-07-04 ENCOUNTER — Other Ambulatory Visit: Payer: Self-pay

## 2020-07-04 ENCOUNTER — Encounter: Payer: Self-pay | Admitting: Emergency Medicine

## 2020-07-04 ENCOUNTER — Emergency Department
Admission: EM | Admit: 2020-07-04 | Discharge: 2020-07-04 | Disposition: A | Discharge status: Home / Self Care | Payer: Self-pay | Attending: Emergency Medicine | Admitting: Emergency Medicine

## 2020-07-04 DIAGNOSIS — F17210 Nicotine dependence, cigarettes, uncomplicated: Secondary | ICD-10-CM | POA: Insufficient documentation

## 2020-07-04 DIAGNOSIS — I10 Essential (primary) hypertension: Secondary | ICD-10-CM | POA: Insufficient documentation

## 2020-07-04 DIAGNOSIS — K029 Dental caries, unspecified: Secondary | ICD-10-CM | POA: Insufficient documentation

## 2020-07-04 HISTORY — DX: Morbid (severe) obesity due to excess calories: E66.01

## 2020-07-04 MED ORDER — IBUPROFEN 600 MG PO TABS: ORAL_TABLET | ORAL | 0 refills | Status: DC

## 2020-07-04 MED ORDER — LIDOCAINE VISCOUS HCL 2 % MT SOLN
15.0000 mL | Freq: Once | OROMUCOSAL | Status: AC
  Administered 2020-07-04: 15 mL via OROMUCOSAL
  Filled 2020-07-04: qty 15

## 2020-07-04 MED ORDER — OMEPRAZOLE MAGNESIUM 20 MG PO TBEC: 20.0000 mg | DELAYED_RELEASE_TABLET | Freq: Every day | ORAL | 1 refills | Status: AC

## 2020-07-04 MED ORDER — ACETAMINOPHEN 325 MG PO TABS
650.0000 mg | ORAL_TABLET | Freq: Once | ORAL | Status: AC
  Administered 2020-07-04: 650 mg via ORAL
  Filled 2020-07-04: qty 2

## 2020-07-04 MED ORDER — HYDROCODONE-ACETAMINOPHEN 5-325 MG PO TABS
1.0000 | ORAL_TABLET | Freq: Once | ORAL | Status: AC
  Administered 2020-07-04: 1 via ORAL
  Filled 2020-07-04: qty 1

## 2020-07-04 MED ORDER — MAGIC MOUTHWASH W/LIDOCAINE: 5.0000 mL | Freq: Four times a day (QID) | ORAL | 0 refills | Status: DC | PRN

## 2020-07-04 NOTE — ED Triage Notes (Signed)
Patient ambulatory to triage with steady gait, without difficulty or distress noted; pt reports left upper dental pain since yesterday unrelieved by OTC

## 2020-07-04 NOTE — Discharge Instructions (Signed)

## 2020-07-04 NOTE — ED Provider Notes (Addendum)
[facility] Emergency Department Provider Note  ____________________________________________   Event Date/Time   First MD Initiated Contact with Patient 07/04/20 [phone]     (approximate)  I have reviewed the triage vital signs and the nursing notes.   HISTORY  Chief Complaint Dental Pain    HPI Guy Ryan. is a 40 y.o. male with medical history as listed below who presents for evaluation of about 2 days of pain in his rear upper tooth on the left.   He thinks he has cavities.  He has not seen a dentist for years.  Nothing particular makes symptoms better or worse except that it seems to be worse at night when he is trying to sleep, otherwise during the day he thinks he is distracted and does not notice it as much.  No swelling.  No difficulty swallowing or breathing.        Past Medical History:  Diagnosis Date  . Hypertension   . Morbid obesity (HCC)     There are no problems to display for this patient.   History reviewed. No pertinent surgical history.  Prior to Admission medications   Medication Sig Start Date End Date Taking? Authorizing Provider  ibuprofen (ADVIL) 600 MG tablet Take 1 tablet by mouth three times daily with meals 07/04/20  Yes Loleta Rose, MD  magic mouthwash w/lidocaine SOLN Take 5 mLs by mouth 4 (four) times daily as needed for mouth pain. Swish and spit, do not swallow the solution. 07/04/20  Yes Loleta Rose, MD  omeprazole (PRILOSEC OTC) 20 MG tablet Take 1 tablet (20 mg total) by mouth daily. 07/04/20 07/04/21 Yes Loleta Rose, MD    Allergies Percocet [oxycodone-acetaminophen]  History reviewed. No pertinent family history.  Social History Social History   Tobacco Use  . Smoking status: Current Every Day Smoker    Packs/day: 0.50    Types: Cigarettes  . Smokeless tobacco: Never Used  Substance Use Topics  . Alcohol use: No  . Drug use: No    Review of Systems Constitutional: No fever/chills Eyes:  No visual changes. ENT: Positive for dental pain as described above. Cardiovascular: Denies chest pain. Respiratory: Denies shortness of breath. Gastrointestinal: No abdominal pain.   Neurological: Negative for headaches, focal weakness or numbness.   ____________________________________________   PHYSICAL EXAM:  VITAL SIGNS: ED Triage Vitals  Enc Vitals Group     BP 07/04/20 0049 (!) 178/114     Pulse Rate 07/04/20 0049 81     Resp 07/04/20 0049 20     Temp 07/04/20 0049 98.5 F (36.9 C)     Temp Source 07/04/20 0049 Oral     SpO2 07/04/20 0049 98 %     Weight 07/04/20 0039 (!) 201.9 kg (445 lb)     Height 07/04/20 0039 1.702 m (5\' 7" )     Head Circumference --      Peak Flow --      Pain Score 07/04/20 0039 3     Pain Loc --      Pain Edu? --      Excl. in GC? --     Constitutional: Alert and oriented.  Eyes: Conjunctivae are normal.  Head: Atraumatic. Nose: No congestion/rhinnorhea. Mouth/Throat: Chronic dental caries.  His left upper rear molar appears cracked with chronic caries.  There is no significant tenderness to palpation and no surrounding inflammation or infection.  No evidence of emergent odontogenic infection such as Ludwig's angina, no sign of peritonsillar abscess.  No trismus. Neck: No stridor.  No meningeal signs.   Cardiovascular: Normal rate, regular rhythm. Good peripheral circulation. Respiratory: Normal respiratory effort.  No retractions. Neurologic:  Normal speech and language. No gross focal neurologic deficits are appreciated.  Skin:  Skin is warm, dry and intact. Psychiatric: Mood and affect are normal. Speech and behavior are normal.  ____________________________________________     INITIAL IMPRESSION / MDM / ASSESSMENT AND PLAN / ED COURSE  As part of my medical decision making, I reviewed the following data within the electronic MEDICAL RECORD NUMBER Nursing notes reviewed and incorporated, Old chart reviewed, Notes from prior ED visits  and [place] Controlled Substance Database   Dental pain that seems to be the result of dental caries and chronic dental fracture.  No evidence of acute infection.  No indication for antibiotics.  Medications as listed below and I provided dental resource guide for outpatient follow-up.  I gave my usual and customary return precautions.         ____________________________________________  FINAL CLINICAL IMPRESSION(S) / ED DIAGNOSES  Final diagnoses:  Pain due to dental caries     MEDICATIONS GIVEN DURING THIS VISIT:  Medications  lidocaine (XYLOCAINE) 2 % viscous mouth solution 15 mL (has no administration in time range)  HYDROcodone-acetaminophen (NORCO/VICODIN) 5-325 MG per tablet 1 tablet (has no administration in time range)  acetaminophen (TYLENOL) tablet 650 mg (has no administration in time range)     ED Discharge Orders         Ordered    magic mouthwash w/lidocaine SOLN  4 times daily PRN       Note to Pharmacy: Please mix viscous lidocaine 2% with magic mouthwash solution so that the lidocaine comprises approximately 25 % of the total solution.   07/04/20 0147    ibuprofen (ADVIL) 600 MG tablet        07/04/20 0147    omeprazole (PRILOSEC OTC) 20 MG tablet  Daily        07/04/20 0147          *Please note:  Guy Ban. was evaluated in Emergency Department on 07/04/2020 for the symptoms described in the history of present illness. He was evaluated in the context of the global COVID-19 pandemic, which necessitated consideration that the patient might be at risk for infection with the SARS-CoV-2 virus that causes COVID-19. Institutional protocols and algorithms that pertain to the evaluation of patients at risk for COVID-19 are in a state of rapid change based on information released by regulatory bodies including the CDC and federal and state organizations. These policies and algorithms were followed during the patient's care in the ED.  Some ED evaluations and  interventions may be delayed as a result of limited staffing during and after the pandemic.*  Note:  This document was prepared using Dragon voice recognition software and may include unintentional dictation errors.   Loleta Rose, MD 07/04/20 [id number]    Loleta Rose, MD 07/04/20 [phone]

## 2020-07-04 NOTE — ED Notes (Signed)
Pt reports the onset of L upper dental pain that started two days ago. Pt reports taking aleve and goody powder with relief. Pt then states he developed nausea and vomiting all day yesterday but had no dental pain. Pain returned last night.

## 2020-09-28 ENCOUNTER — Emergency Department
Admission: EM | Admit: 2020-09-28 | Discharge: 2020-09-28 | Disposition: A | Discharge status: Home / Self Care | Payer: Self-pay | Attending: Emergency Medicine | Admitting: Emergency Medicine

## 2020-09-28 ENCOUNTER — Emergency Department: Payer: Self-pay

## 2020-09-28 ENCOUNTER — Other Ambulatory Visit: Payer: Self-pay

## 2020-09-28 ENCOUNTER — Encounter: Payer: Self-pay | Admitting: Emergency Medicine

## 2020-09-28 DIAGNOSIS — N200 Calculus of kidney: Secondary | ICD-10-CM | POA: Insufficient documentation

## 2020-09-28 DIAGNOSIS — I10 Essential (primary) hypertension: Secondary | ICD-10-CM | POA: Insufficient documentation

## 2020-09-28 DIAGNOSIS — F17210 Nicotine dependence, cigarettes, uncomplicated: Secondary | ICD-10-CM | POA: Insufficient documentation

## 2020-09-28 HISTORY — DX: Calculus of kidney: N20.0

## 2020-09-28 LAB — URINALYSIS, COMPLETE (UACMP) WITH MICROSCOPIC
Bacteria, UA: NONE SEEN
Bilirubin Urine: NEGATIVE
Glucose, UA: NEGATIVE mg/dL
Ketones, ur: NEGATIVE mg/dL
Leukocytes,Ua: NEGATIVE
Nitrite: NEGATIVE
Protein, ur: NEGATIVE mg/dL
RBC / HPF: >50 RBC/hpf — ABNORMAL HIGH (ref 0–5)
Specific Gravity, Urine: 1.015 (ref 1.005–1.030)
pH: 5 (ref 5.0–8.0)

## 2020-09-28 LAB — CBC WITH DIFFERENTIAL/PLATELET
Abs Immature Granulocytes: 0.03 10*3/uL (ref 0.00–0.07)
Basophils Absolute: 0.1 10*3/uL (ref 0.0–0.1)
Basophils Relative: 1 %
Eosinophils Absolute: 0.1 10*3/uL (ref 0.0–0.5)
Eosinophils Relative: 2 %
HCT: 41 % (ref 39.0–52.0)
Hemoglobin: 13.7 g/dL (ref 13.0–17.0)
Immature Granulocytes: 0 %
Lymphocytes Relative: 18 %
Lymphs Abs: 1.4 10*3/uL (ref 0.7–4.0)
MCH: 28.8 pg (ref 26.0–34.0)
MCHC: 33.4 g/dL (ref 30.0–36.0)
MCV: 86.3 fL (ref 80.0–100.0)
Monocytes Absolute: 0.8 10*3/uL (ref 0.1–1.0)
Monocytes Relative: 10 %
Neutro Abs: 5.4 10*3/uL (ref 1.7–7.7)
Neutrophils Relative %: 69 %
Platelets: 240 10*3/uL (ref 150–400)
RBC: 4.75 MIL/uL (ref 4.22–5.81)
RDW: 14 % (ref 11.5–15.5)
WBC: 7.8 10*3/uL (ref 4.0–10.5)
nRBC: 0 % (ref 0.0–0.2)

## 2020-09-28 LAB — COMPREHENSIVE METABOLIC PANEL
ALT: 20 U/L (ref 0–44)
AST: 24 U/L (ref 15–41)
Albumin: 4.4 g/dL (ref 3.5–5.0)
Alkaline Phosphatase: 89 U/L (ref 38–126)
Anion gap: 8 (ref 5–15)
BUN: 14 mg/dL (ref 6–20)
CO2: 23 mmol/L (ref 22–32)
Calcium: 9.2 mg/dL (ref 8.9–10.3)
Chloride: 106 mmol/L (ref 98–111)
Creatinine, Ser: 1.08 mg/dL (ref 0.61–1.24)
GFR, Estimated: >60 mL/min (ref >60)
Glucose, Bld: 148 mg/dL — ABNORMAL HIGH (ref 70–99)
Potassium: 3.9 mmol/L (ref 3.5–5.1)
Sodium: 137 mmol/L (ref 135–145)
Total Bilirubin: 0.7 mg/dL (ref 0.3–1.2)
Total Protein: 7.8 g/dL (ref 6.5–8.1)

## 2020-09-28 LAB — LIPASE, BLOOD: Lipase: 77 U/L — ABNORMAL HIGH (ref 11–51)

## 2020-09-28 MED ORDER — ONDANSETRON HCL 4 MG/2ML IJ SOLN
4.0000 mg | Freq: Once | INTRAMUSCULAR | Status: AC
  Administered 2020-09-28: 4 mg via INTRAVENOUS
  Filled 2020-09-28: qty 2

## 2020-09-28 MED ORDER — SODIUM CHLORIDE 0.9 % IV BOLUS
1000.0000 mL | Freq: Once | INTRAVENOUS | Status: AC
  Administered 2020-09-28: 1000 mL via INTRAVENOUS

## 2020-09-28 MED ORDER — HYDROMORPHONE HCL 1 MG/ML IJ SOLN
1.0000 mg | Freq: Once | INTRAMUSCULAR | Status: AC
  Administered 2020-09-28: 1 mg via INTRAVENOUS
  Filled 2020-09-28: qty 1

## 2020-09-28 MED ORDER — KETOROLAC TROMETHAMINE 30 MG/ML IJ SOLN
30.0000 mg | Freq: Once | INTRAMUSCULAR | Status: AC
  Administered 2020-09-28: 30 mg via INTRAVENOUS
  Filled 2020-09-28: qty 1

## 2020-09-28 MED ORDER — HYDROCODONE-ACETAMINOPHEN 5-325 MG PO TABS: 1.0000 | ORAL_TABLET | Freq: Four times a day (QID) | ORAL | 0 refills | Status: AC | PRN

## 2020-09-28 MED ORDER — ONDANSETRON 4 MG PO TBDP: 4.0000 mg | ORAL_TABLET | Freq: Three times a day (TID) | ORAL | 0 refills | Status: AC | PRN

## 2020-09-28 MED ORDER — TAMSULOSIN HCL 0.4 MG PO CAPS: 0.4000 mg | ORAL_CAPSULE | Freq: Every day | ORAL | 0 refills | Status: AC

## 2020-09-28 NOTE — ED Notes (Signed)
See triage note  Presents with left flank pain with n/v  States pain woke him up this am  Dr Marcello Moores in with pt on arrival

## 2020-09-28 NOTE — Discharge Instructions (Addendum)
You can take Ibuprofen (Motrin) 600 mg every 6-8 hours for moderate pain  Take the prescribed meds for severe pain  Drink at least 8 glasses of water daily

## 2020-09-28 NOTE — ED Triage Notes (Addendum)
Patient ambulatory to triage with steady gait, without difficulty or distress noted; pt reports awakening with left lower abd pain radiating into flank accomp by nausea; st hx kidney stones & "feels same"

## 2020-09-28 NOTE — ED Provider Notes (Signed)
[facility] Emergency Department Provider Note  ____________________________________________   Event Date/Time   First MD Initiated Contact with Patient 09/28/20 [phone]     (approximate)  I have reviewed the triage vital signs and the nursing notes.   HISTORY  Chief Complaint Abdominal Pain    HPI Guy Ryan. is a 40 y.o. male  with h/o kidney stones her with flank pain.  Patient reports that he woke up at around 2 AM with acute, severe, left flank pain.  The pain has been persistent, 8-9 out of 10, since onset.  He states he has had some associated mild urinary hesitancy.  He has had associated nausea and vomiting and was unable to tolerate any p.o. intake since the onset of pain.  He states he felt completely fine prior to going to bed, and has had no preceding fever, nausea, vomiting, abdominal pain, or urinary symptoms.  Pain is worse with certain movements, is slightly alleviated when he lays on his left.  No testicular pain or swelling.  He has a history of kidney stones in the past, all of which she has been able to pass on his own.  No history of prior urological instrumentation or procedures.       Past Medical History:  Diagnosis Date   Hypertension    Kidney stone    Morbid obesity (HCC)     There are no problems to display for this patient.   History reviewed. No pertinent surgical history.  Prior to Admission medications   Medication Sig Start Date End Date Taking? Authorizing Provider  HYDROcodone-acetaminophen (NORCO/VICODIN) 5-325 MG tablet Take 1-2 tablets by mouth every 6 (six) hours as needed for severe pain (no more than 6 tabs daily). 09/28/20 09/28/21 Yes Shaune Pollack, MD  ondansetron (ZOFRAN ODT) 4 MG disintegrating tablet Take 1 tablet (4 mg total) by mouth every 8 (eight) hours as needed for nausea or vomiting. 09/28/20  Yes Shaune Pollack, MD  tamsulosin (FLOMAX) 0.4 MG CAPS capsule Take 1 capsule (0.4 mg total) by mouth  daily for 5 days. Or until stone is passed, then stop 09/28/20 10/03/20 Yes Shaune Pollack, MD  ibuprofen (ADVIL) 600 MG tablet Take 1 tablet by mouth three times daily with meals 07/04/20   Loleta Rose, MD  omeprazole (PRILOSEC OTC) 20 MG tablet Take 1 tablet (20 mg total) by mouth daily. 07/04/20 07/04/21  Loleta Rose, MD    Allergies Percocet [oxycodone-acetaminophen]  No family history on file.  Social History Social History   Tobacco Use   Smoking status: Every Day    Packs/day: 0.50    Types: Cigarettes   Smokeless tobacco: Never  Vaping Use   Vaping Use: Never used  Substance Use Topics   Alcohol use: No   Drug use: Yes    Types: Marijuana    Review of Systems  Review of Systems  Constitutional:  Negative for chills and fever.  HENT:  Negative for sore throat.   Respiratory:  Negative for shortness of breath.   Cardiovascular:  Negative for chest pain.  Gastrointestinal:  Positive for nausea and vomiting. Negative for abdominal pain.  Genitourinary:  Positive for flank pain and frequency.  Musculoskeletal:  Negative for neck pain.  Skin:  Negative for rash and wound.  Allergic/Immunologic: Negative for immunocompromised state.  Neurological:  Negative for weakness and numbness.  Hematological:  Does not bruise/bleed easily.  All other systems reviewed and are negative.   ____________________________________________  PHYSICAL EXAM:  VITAL SIGNS: ED Triage Vitals  Enc Vitals Group     BP 09/28/20 0507 (!) 181/87     Pulse Rate 09/28/20 0507 66     Resp 09/28/20 0507 20     Temp 09/28/20 0507 98 F (36.7 C)     Temp src --      SpO2 09/28/20 0507 97 %     Weight 09/28/20 0506 (!) 440 lb (199.6 kg)     Height 09/28/20 0506 5\' 7"  (1.702 m)     Head Circumference --      Peak Flow --      Pain Score 09/28/20 0506 7     Pain Loc --      Pain Edu? --      Excl. in GC? --      Physical Exam Vitals and nursing note reviewed.  Constitutional:       General: He is not in acute distress.    Appearance: He is well-developed.     Comments: Appears uncomfortable  HENT:     Head: Normocephalic and atraumatic.  Eyes:     Conjunctiva/sclera: Conjunctivae normal.  Cardiovascular:     Rate and Rhythm: Normal rate and regular rhythm.     Heart sounds: Normal heart sounds.  Pulmonary:     Effort: Pulmonary effort is normal. No respiratory distress.     Breath sounds: No wheezing.  Abdominal:     General: There is no distension.     Tenderness: There is abdominal tenderness in the suprapubic area, left upper quadrant and left lower quadrant.  Musculoskeletal:     Cervical back: Neck supple.  Skin:    General: Skin is warm.     Capillary Refill: Capillary refill takes less than 2 seconds.     Findings: No rash.  Neurological:     Mental Status: He is alert and oriented to person, place, and time.     Motor: No abnormal muscle tone.      ____________________________________________   LABS (all labs ordered are listed, but only abnormal results are displayed)  Labs Reviewed  COMPREHENSIVE METABOLIC PANEL - Abnormal; Notable for the following components:      Result Value   Glucose, Bld 148 (*)    All other components within normal limits  LIPASE, BLOOD - Abnormal; Notable for the following components:   Lipase 77 (*)    All other components within normal limits  URINALYSIS, COMPLETE (UACMP) WITH MICROSCOPIC - Abnormal; Notable for the following components:   Color, Urine YELLOW (*)    APPearance HAZY (*)    Hgb urine dipstick LARGE (*)    RBC / HPF >50 (*)    All other components within normal limits  URINE CULTURE  CBC WITH DIFFERENTIAL/PLATELET    ____________________________________________  EKG:  ________________________________________  RADIOLOGY All imaging, including plain films, CT scans, and ultrasounds, independently reviewed by me, and interpretations confirmed via formal radiology reads.  ED MD  interpretation:   CT stone: 4 mm stone distal left ureter, likely reactive lymph nodes  Official radiology report(s): CT Renal Stone Study  Result Date: 09/28/2020 CLINICAL DATA:  Left flank pain, kidney stone suspected EXAM: CT ABDOMEN AND PELVIS WITHOUT CONTRAST TECHNIQUE: Multidetector CT imaging of the abdomen and pelvis was performed following the standard protocol without IV contrast. COMPARISON:  None. FINDINGS: Lower chest: Fat containing Bochdalek's hernia. Lung bases are clear. Heart size is normal. Hepatobiliary: Decreased attenuation of the hepatic parenchyma suggestive of hepatic steatosis. No focal  liver lesion identified on unenhanced CT. Calcified stone is present within a nondistended gallbladder. No pericholecystic inflammatory changes by CT. No biliary dilatation. Pancreas: Unremarkable. No pancreatic ductal dilatation or surrounding inflammatory changes. Spleen: Normal in size without focal abnormality. Adrenals/Urinary Tract: Unremarkable adrenal glands. 4 mm stone within the distal left ureter above the level of the UVJ resulting in mild left-sided hydroureteronephrosis. There is associated left-sided perinephric and periureteral stranding. No additional left-sided renal calculi. Right kidney has an unremarkable unenhanced appearance. No right-sided stone or hydronephrosis. Urinary bladder is unremarkable for the degree of distension. Stomach/Bowel: Stomach is within normal limits. Appendix appears normal. No evidence of bowel wall thickening, distention, or inflammatory changes. Vascular/Lymphatic: There are numerous mildly enlarged retroperitoneal lymph nodes. Reference nodes include upper para-aortic node just below the level of the left renal vein measuring 10 mm (series 2, image 34), 11 mm precaval node (series 2, image 48), 11 mm left external iliac node (series 2, image 75). There are also enlarged bilateral inguinal lymph nodes measuring 19 mm on the left and 17 mm on the right  (series 2, image 104). Minimal abdominal aortic atherosclerotic calcification without aneurysm. Reproductive: Prostate is unremarkable. Other: No free fluid. No abdominopelvic fluid collection. No pneumoperitoneum. No abdominal wall hernia. Musculoskeletal: No acute osseous findings. Prominent lower thoracic endplate osteophyte formation. Mild degenerative changes of the bilateral hips. IMPRESSION: 1. 4 mm distal left ureteral stone above the level of the UVJ resulting in mild left-sided hydroureteronephrosis. 2. Multiple mildly enlarged retroperitoneal and bilateral inguinal lymph nodes, which may be reactive or secondary to an underlying lymphoproliferative process. Clinical/laboratory correlation is recommended. 3. Hepatic steatosis. 4. Cholelithiasis without evidence of acute cholecystitis. Electronically Signed   By: Duanne Guess D.O.   On: 09/28/2020 07:49    ____________________________________________  PROCEDURES   Procedure(s) performed (including Critical Care):  Procedures  ____________________________________________  INITIAL IMPRESSION / MDM / ASSESSMENT AND PLAN / ED COURSE  As part of my medical decision making, I reviewed the following data within the electronic MEDICAL RECORD NUMBER Nursing notes reviewed and incorporated, Old chart reviewed, Notes from prior ED visits, and [place] Controlled Substance Database       *Parv Manthey. was evaluated in Emergency Department on 09/28/2020 for the symptoms described in the history of present illness. He was evaluated in the context of the global COVID-19 pandemic, which necessitated consideration that the patient might be at risk for infection with the SARS-CoV-2 virus that causes COVID-19. Institutional protocols and algorithms that pertain to the evaluation of patients at risk for COVID-19 are in a state of rapid change based on information released by regulatory bodies including the CDC and federal and state organizations. These policies  and algorithms were followed during the patient's care in the ED.  Some ED evaluations and interventions may be delayed as a result of limited staffing during the pandemic.*     Medical Decision Making:  40 yo M here with flank pain. CT stone study reviewed by me, remarkable for 4 mm stone in the distal left ureter.  There are some surrounding likely reactive lymph nodes.  CBC shows no evidence of leukocytosis, left shift, or other abnormalities to suggest lymphoproliferative process.  He has no other lymphadenopathy.. UA with hematuria, but no pyuria and pt has no fever, chills, or signs of infection. He was asymptomatic prior to the onset of pain. No testicular/scrotal pain, swelling, or signs of torsion. Renal function is at baseline. Pt feels much better with analgesia in  ED and is tolerating PO, HDS, in no distress. Will d/c with outpt analgesia, flomax, encouraged hydration and outpatient Urology follow-up as needed.  ____________________________________________  FINAL CLINICAL IMPRESSION(S) / ED DIAGNOSES  Final diagnoses:  Kidney stone on left side     MEDICATIONS GIVEN DURING THIS VISIT:  Medications  HYDROmorphone (DILAUDID) injection 1 mg (1 mg Intravenous Given 09/28/20 0747)  ondansetron (ZOFRAN) injection 4 mg (4 mg Intravenous Given 09/28/20 0747)  ketorolac (TORADOL) 30 MG/ML injection 30 mg (30 mg Intravenous Given 09/28/20 0747)  sodium chloride 0.9 % bolus 1,000 mL (1,000 mLs Intravenous New Bag/Given 09/28/20 0748)     ED Discharge Orders          Ordered    HYDROcodone-acetaminophen (NORCO/VICODIN) 5-325 MG tablet  Every 6 hours PRN        09/28/20 0814    ondansetron (ZOFRAN ODT) 4 MG disintegrating tablet  Every 8 hours PRN        09/28/20 0814    tamsulosin (FLOMAX) 0.4 MG CAPS capsule  Daily        09/28/20 0814             Note:  This document was prepared using Dragon voice recognition software and may include unintentional dictation errors.    Shaune PollackIsaacs, Brylea Pita, MD 09/28/20 [phone]

## 2020-09-30 LAB — URINE CULTURE
Culture: NO GROWTH
Special Requests: NORMAL

## 2020-10-24 ENCOUNTER — Ambulatory Visit: Payer: Self-pay | Admitting: Urology

## 2020-10-26 ENCOUNTER — Encounter: Payer: Self-pay | Admitting: Urology

## 2022-02-23 IMAGING — CT CT RENAL STONE PROTOCOL
2 of 4 series · 16 of 46 positions shown, 18 images · non-contrast
Comparison: None.

CLINICAL DATA: Left flank pain, kidney stone suspected

EXAM:
CT ABDOMEN AND PELVIS WITHOUT CONTRAST
TECHNIQUE: Multidetector CT imaging of the abdomen and pelvis was performed
following the standard protocol without IV contrast.

[Series 2: stone full standard · axial · 0.98mm/px · z∈[-616,-141]mm · 13 of 107 slices shown, 15 images]
[im 8/107  soft-tissue]
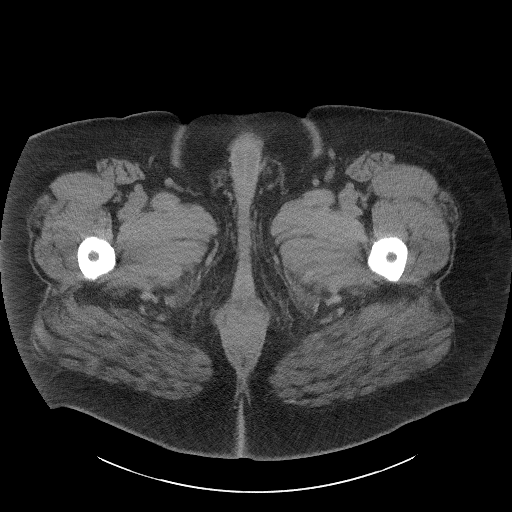
[im 8/107  bone]
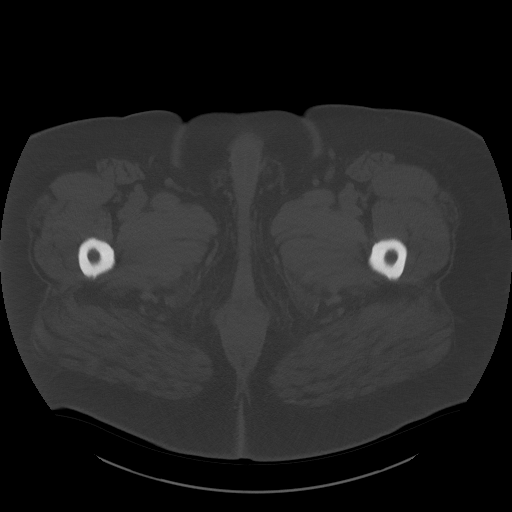
[im 16/107  soft-tissue]
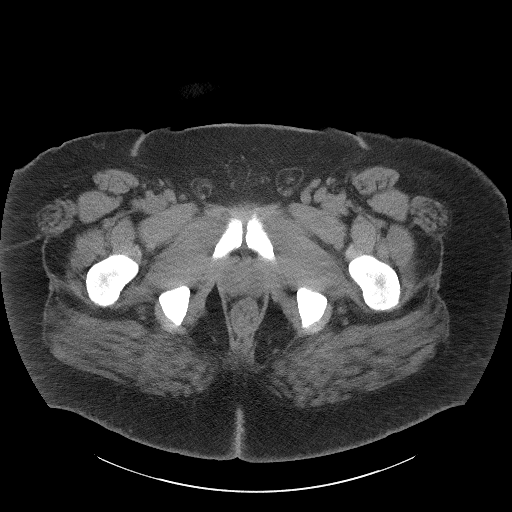
[im 24/107  soft-tissue]
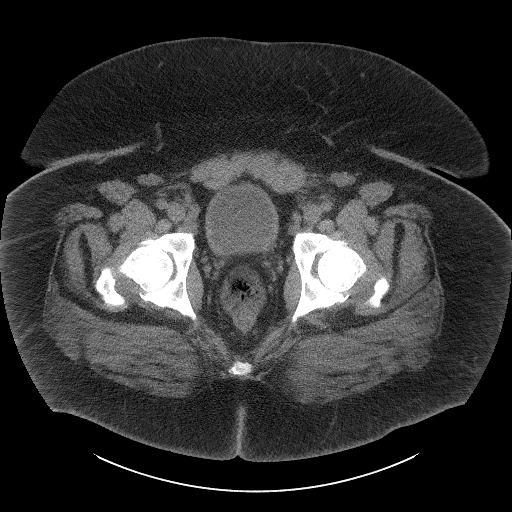
[im 32/107  soft-tissue]
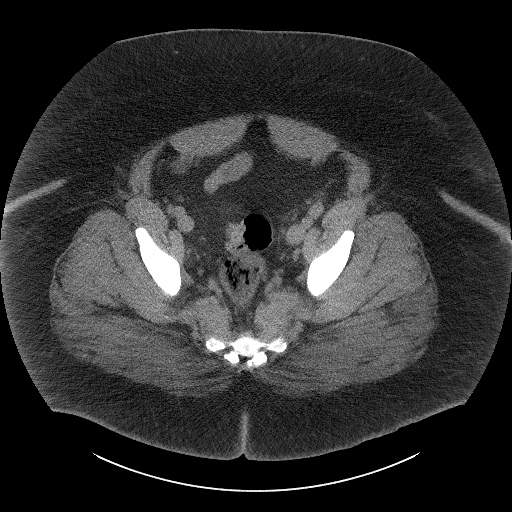
[im 40/107  soft-tissue]
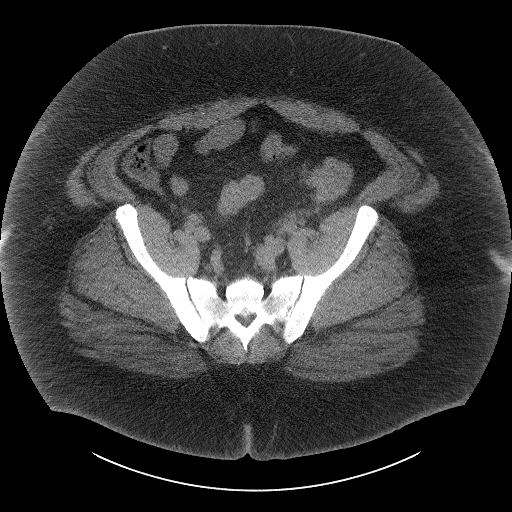
[im 48/107  soft-tissue]
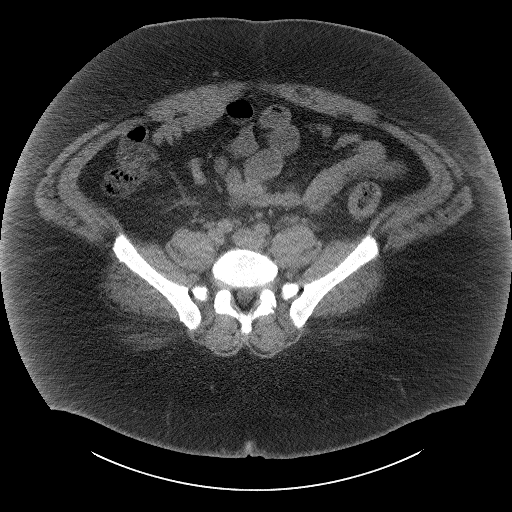
[im 55/107  soft-tissue]
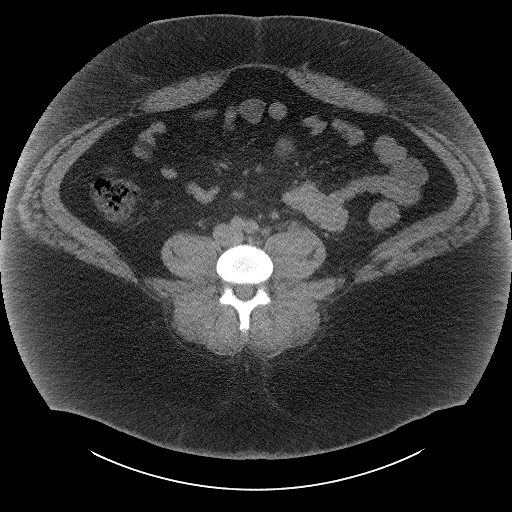
[im 63/107  soft-tissue]
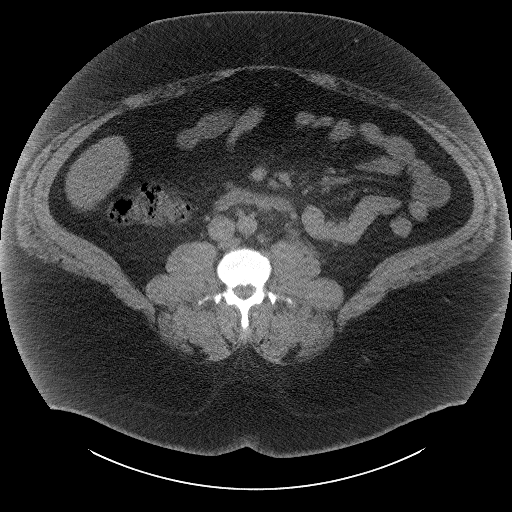
[im 71/107  soft-tissue]
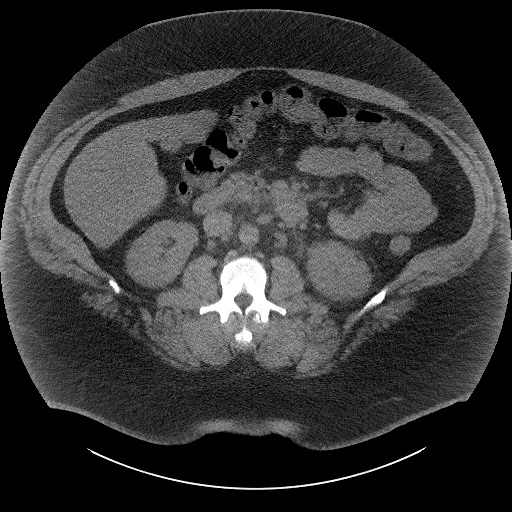
[im 71/107  bone]
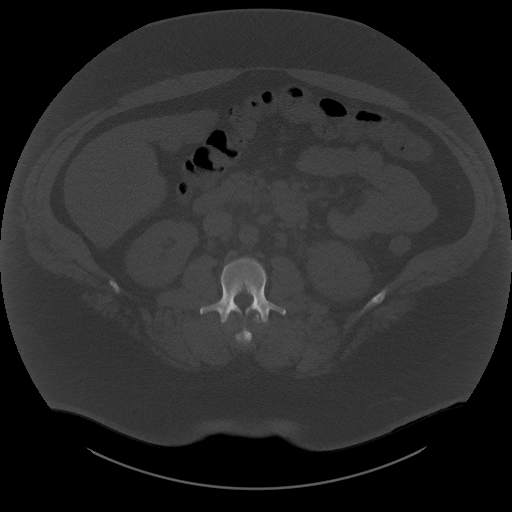
[im 79/107  soft-tissue]
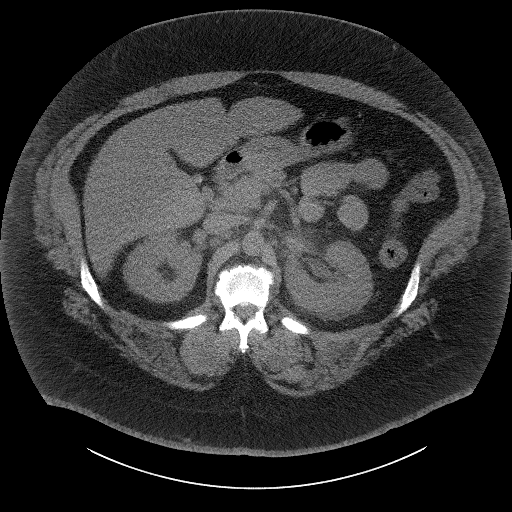
[im 87/107  soft-tissue]
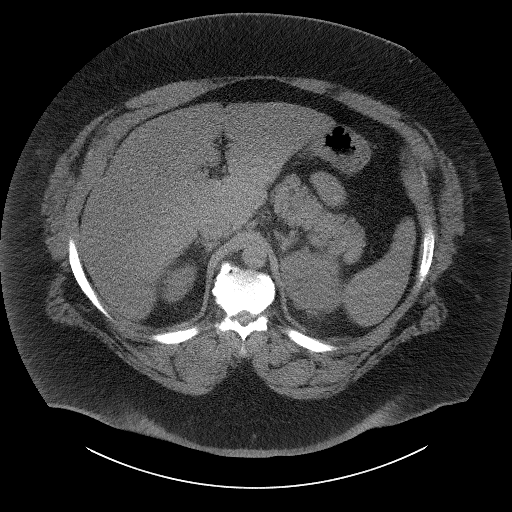
[im 95/107  soft-tissue]
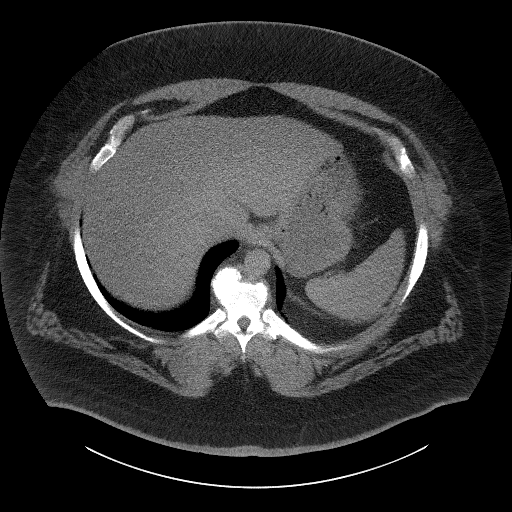
[im 103/107  soft-tissue]
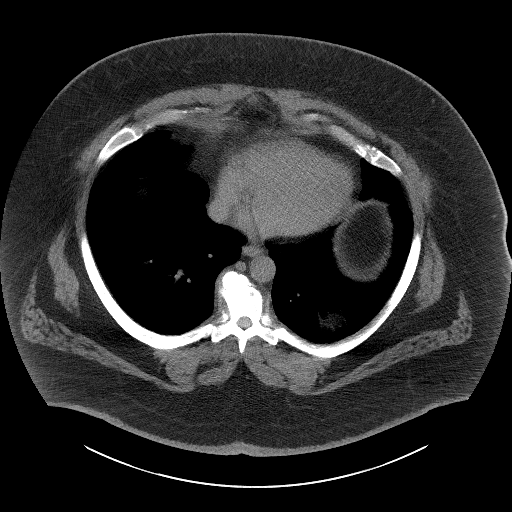

[Series 5: coronal · coronal · 0.99mm/px · 3 of 223 slices shown]
[im 75/223  soft-tissue]
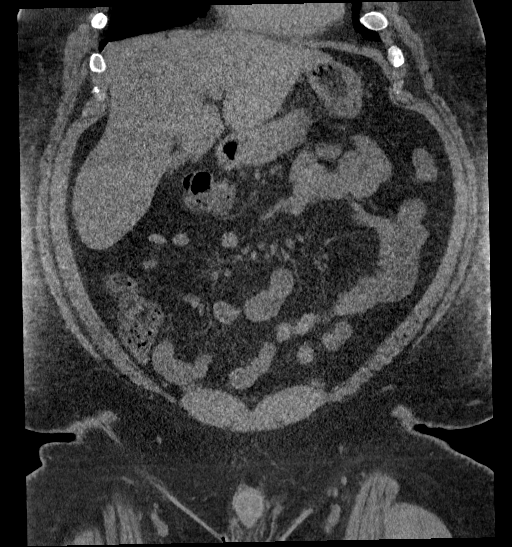
[im 99/223  soft-tissue]
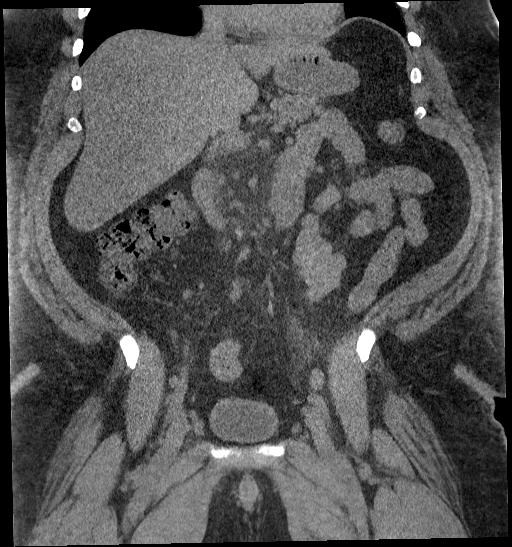
[im 124/223  soft-tissue]
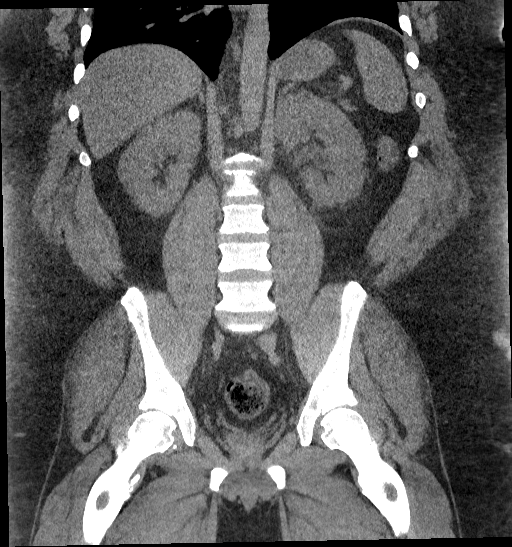

[16 of 46 positions shown; findings below may reference images not displayed]

FINDINGS: Lower chest: Fat containing Bochdalek's hernia. Lung bases are
clear. Heart size is normal.

Hepatobiliary: Decreased attenuation of the hepatic parenchyma
suggestive of hepatic steatosis. No focal liver lesion identified on
unenhanced CT. Calcified stone is present within a nondistended
gallbladder. No pericholecystic inflammatory changes by CT. No
biliary dilatation.

Pancreas: Unremarkable. No pancreatic ductal dilatation or
surrounding inflammatory changes.

Spleen: Normal in size without focal abnormality.

Adrenals/Urinary Tract: Unremarkable adrenal glands. 4 mm stone
within the distal left ureter above the level of the UVJ resulting
in mild left-sided hydroureteronephrosis. There is associated
left-sided perinephric and periureteral stranding. No additional
left-sided renal calculi. Right kidney has an unremarkable
unenhanced appearance. No right-sided stone or hydronephrosis.
Urinary bladder is unremarkable for the degree of distension.

Stomach/Bowel: Stomach is within normal limits. Appendix appears
normal. No evidence of bowel wall thickening, distention, or
inflammatory changes.

Vascular/Lymphatic: There are numerous mildly enlarged
retroperitoneal lymph nodes. Reference nodes include upper
para-aortic node just below the level of the left renal vein
measuring 10 mm (series 2, image 34), 11 mm precaval node (series 2,
image 48), 11 mm left external iliac node (series 2, image 75).
There are also enlarged bilateral inguinal lymph nodes measuring 19
mm on the left and 17 mm on the right (series 2, image 104). Minimal
abdominal aortic atherosclerotic calcification without aneurysm.

Reproductive: Prostate is unremarkable.

Other: No free fluid. No abdominopelvic fluid collection. No
pneumoperitoneum. No abdominal wall hernia.

Musculoskeletal: No acute osseous findings. Prominent lower thoracic
endplate osteophyte formation. Mild degenerative changes of the
bilateral hips.
IMPRESSION: 1. 4 mm distal left ureteral stone above the level of the UVJ
resulting in mild left-sided hydroureteronephrosis.
2. Multiple mildly enlarged retroperitoneal and bilateral inguinal
lymph nodes, which may be reactive or secondary to an underlying
lymphoproliferative process. Clinical/laboratory correlation is
recommended.
3. Hepatic steatosis.
4. Cholelithiasis without evidence of acute cholecystitis.

## 2022-08-11 ENCOUNTER — Other Ambulatory Visit: Payer: Self-pay

## 2022-08-11 ENCOUNTER — Emergency Department
Admission: EM | Admit: 2022-08-11 | Discharge: 2022-08-11 | Disposition: A | Discharge status: Home / Self Care | Payer: 59 | Attending: Emergency Medicine | Admitting: Emergency Medicine

## 2022-08-11 ENCOUNTER — Encounter: Payer: Self-pay | Admitting: Emergency Medicine

## 2022-08-11 DIAGNOSIS — Z91199 Patient's noncompliance with other medical treatment and regimen due to unspecified reason: Secondary | ICD-10-CM

## 2022-08-11 DIAGNOSIS — I10 Essential (primary) hypertension: Secondary | ICD-10-CM | POA: Diagnosis not present

## 2022-08-11 DIAGNOSIS — Z91148 Patient's other noncompliance with medication regimen for other reason: Secondary | ICD-10-CM | POA: Diagnosis not present

## 2022-08-11 DIAGNOSIS — M436 Torticollis: Secondary | ICD-10-CM | POA: Diagnosis not present

## 2022-08-11 DIAGNOSIS — M542 Cervicalgia: Secondary | ICD-10-CM | POA: Diagnosis not present

## 2022-08-11 LAB — COMPREHENSIVE METABOLIC PANEL
ALT: 14 U/L (ref 0–44)
AST: 20 U/L (ref 15–41)
Albumin: 4.3 g/dL (ref 3.5–5.0)
Alkaline Phosphatase: 90 U/L (ref 38–126)
Anion gap: 9 (ref 5–15)
BUN: 9 mg/dL (ref 6–20)
CO2: 25 mmol/L (ref 22–32)
Calcium: 8.9 mg/dL (ref 8.9–10.3)
Chloride: 103 mmol/L (ref 98–111)
Creatinine, Ser: 0.79 mg/dL (ref 0.61–1.24)
GFR, Estimated: >60 mL/min (ref >60)
Glucose, Bld: 115 mg/dL — ABNORMAL HIGH (ref 70–99)
Potassium: 4.3 mmol/L (ref 3.5–5.1)
Sodium: 137 mmol/L (ref 135–145)
Total Bilirubin: 0.5 mg/dL (ref 0.3–1.2)
Total Protein: 7.8 g/dL (ref 6.5–8.1)

## 2022-08-11 LAB — CBC WITH DIFFERENTIAL/PLATELET
Abs Immature Granulocytes: 0.04 10*3/uL (ref 0.00–0.07)
Basophils Absolute: 0 10*3/uL (ref 0.0–0.1)
Basophils Relative: 1 %
Eosinophils Absolute: 0.1 10*3/uL (ref 0.0–0.5)
Eosinophils Relative: 2 %
HCT: 44.1 % (ref 39.0–52.0)
Hemoglobin: 14.1 g/dL (ref 13.0–17.0)
Immature Granulocytes: 1 %
Lymphocytes Relative: 20 %
Lymphs Abs: 1.6 10*3/uL (ref 0.7–4.0)
MCH: 27.7 pg (ref 26.0–34.0)
MCHC: 32 g/dL (ref 30.0–36.0)
MCV: 86.6 fL (ref 80.0–100.0)
Monocytes Absolute: 0.6 10*3/uL (ref 0.1–1.0)
Monocytes Relative: 8 %
Neutro Abs: 5.3 10*3/uL (ref 1.7–7.7)
Neutrophils Relative %: 68 %
Platelets: 237 10*3/uL (ref 150–400)
RBC: 5.09 MIL/uL (ref 4.22–5.81)
RDW: 13.8 % (ref 11.5–15.5)
WBC: 7.7 10*3/uL (ref 4.0–10.5)
nRBC: 0 % (ref 0.0–0.2)

## 2022-08-11 MED ORDER — KETOROLAC TROMETHAMINE 30 MG/ML IJ SOLN
30.0000 mg | Freq: Once | INTRAMUSCULAR | Status: AC
  Administered 2022-08-11: 30 mg via INTRAMUSCULAR
  Filled 2022-08-11: qty 1

## 2022-08-11 MED ORDER — AMLODIPINE BESYLATE 5 MG PO TABS: 5.0000 mg | ORAL_TABLET | Freq: Every day | ORAL | 2 refills | Status: AC

## 2022-08-11 MED ORDER — HYDROCODONE-ACETAMINOPHEN 5-325 MG PO TABS: 1.0000 | ORAL_TABLET | Freq: Four times a day (QID) | ORAL | 0 refills | Status: AC | PRN

## 2022-08-11 MED ORDER — IBUPROFEN 800 MG PO TABS: 800.0000 mg | ORAL_TABLET | Freq: Three times a day (TID) | ORAL | 0 refills | Status: AC | PRN

## 2022-08-11 MED ORDER — METHOCARBAMOL 500 MG PO TABS: ORAL_TABLET | ORAL | 0 refills | Status: AC

## 2022-08-11 MED ORDER — AMLODIPINE BESYLATE 5 MG PO TABS
5.0000 mg | ORAL_TABLET | Freq: Once | ORAL | Status: AC
  Administered 2022-08-11: 5 mg via ORAL
  Filled 2022-08-11: qty 1

## 2022-08-11 NOTE — Discharge Instructions (Addendum)
[facility] with [facility] to see if they can follow-up for your blood pressure which was elevated today.  Initial blood pressure was 190/116.  A prescription for blood pressure medication was sent to the pharmacy for you to begin taking every day.  Medications for your neck was sent to the pharmacy.  Be aware that the pain medication and muscle relaxant may cause you to to be drowsy therefore do not drive or operate machinery while taking that medication.  You may also use heat or ice to your neck as needed for discomfort.  A list of clinics is on your discharge papers so that she can call and get an appointment for a primary care provider.  Be aware that it may take several months for you to get in their office as a new patient.  Please go to the following website to schedule new (and existing) patient appointments:   [email or web address]   The following is a list of primary care offices in the area who are accepting new patients at this time.  Please reach out to one of them directly and let them know you would like to schedule an appointment to follow up on an Emergency Department visit, and/or to establish a new primary care provider (PCP).  There are likely other primary care clinics in the are who are accepting new patients, but this is an excellent place to start:  [facility] Lead physician: Dr Shirlee Latch [address] [place], [place] [address] [phone]  [facility] Lead Physician: Dr Alba Cory [address], [place], [place] [address] [phone]  [facility]  Lead Physician: Dr Olevia Perches [address] [place], [place] [address] [phone]  [facility] Lead Physician: Dr Sofie Hartigan [address], [place], [place] [address] [phone]  [facility] Lead Physician: Dr Bari Edward [address] [place],  [place], [place] [address] [phone]

## 2022-08-11 NOTE — ED Provider Notes (Signed)
[facility] Provider Note    Event Date/Time   First MD Initiated Contact with Patient 08/11/22 [phone]     (approximate)   History   Neck Pain   HPI  Guy Fok. is a 42 y.o. male presents to the ED with complaint of neck pain without history of injury.  Patient states he woke up with his neck feeling stiff and decreased range of motion approximately 2 days ago.  He has taken ibuprofen once without any relief.  Denies any previous cervical injuries.  Patient does have a history of hypertension and currently is not taking any blood pressure medication as he is trying to obtain a PCP.  Denies any headache, dizziness, chest pain or shortness of breath.      Physical Exam   Triage Vital Signs: ED Triage Vitals  Enc Vitals Group     BP 08/11/22 0938 (!) 190/116     Pulse Rate 08/11/22 0938 69     Resp 08/11/22 0938 16     Temp 08/11/22 0938 97.8 F (36.6 C)     Temp Source 08/11/22 0938 Oral     SpO2 08/11/22 0938 98 %     Weight 08/11/22 0940 (!) 440 lb 0.6 oz (199.6 kg)     Height 08/11/22 0940 5\' 7"  (1.702 m)     Head Circumference --      Peak Flow --      Pain Score 08/11/22 0939 7     Pain Loc --      Pain Edu? --      Excl. in GC? --     Most recent vital signs: Vitals:   08/11/22 1057 08/11/22 1110  BP: (!) 201/112 (!) 199/88  Pulse: 70 67  Resp:  18  Temp:    SpO2: 98% 98%     General: Awake, no distress.  CV:  Good peripheral perfusion.  Heart regular rate and rhythm. Resp:  Normal effort.  Clear bilaterally. Abd:  No distention.  Other:  No point tenderness on palpation of the cervical spine posteriorly.  There is tenderness on palpation of the paravertebral muscles right side greater than the left.  Range of motion is restricted with lateral movement.  Good muscle strength at 5/5 bilaterally.   ED Results / Procedures / Treatments   Labs (all labs ordered are listed, but only abnormal results are displayed) Labs  Reviewed  COMPREHENSIVE METABOLIC PANEL - Abnormal; Notable for the following components:      Result Value   Glucose, Bld 115 (*)    All other components within normal limits  CBC WITH DIFFERENTIAL/PLATELET      PROCEDURES:  Critical Care performed:   Procedures   MEDICATIONS ORDERED IN ED: Medications  ketorolac (TORADOL) 30 MG/ML injection 30 mg (30 mg Intramuscular Given 08/11/22 1012)  amLODipine (NORVASC) tablet 5 mg (5 mg Oral Given 08/11/22 1110)     IMPRESSION / MDM / ASSESSMENT AND PLAN / ED COURSE  I reviewed the triage vital signs and the nursing notes.   Differential diagnosis includes, but is not limited to, acute cervical strain, torticollis, musculoskeletal pain.  Hypertension uncontrolled, noncompliant.  42 year old male presents to the ED with complaint of decreased cervical range of motion after sleeping in an awkward position.  Patient took ibuprofen once without relief.  Also while talking with patient it was noted that his blood pressure was elevated and patient states he has been out of his blood pressure medication for  2 years.  He is in the process of getting insurance and does not have a PCP at this time.  He was given amlodipine 5 mg while in the ED.  Patient was asymptomatic.  We discussed the importance of following up with a PCP and a list of clinics was given to him along with information about [facility] that he can [facility] to as currently he does not have insurance.  Blood pressure is mildly improved prior to discharge.  Lab work is reassuring.  Patient reported improvement of his neck with the Toradol 30 mg IM.  A prescription for methocarbamol 500 mg 1 or 2 every 6 hours as needed for muscle spasms, hydrocodone and ibuprofen was sent to the pharmacy for him to continue.  A prescription for amlodipine 5 mg 1 daily with 2 refills giving him a 90-day supply until he is able to see a PCP.       Patient's presentation is most consistent with acute  complicated illness / injury requiring diagnostic workup.  FINAL CLINICAL IMPRESSION(S) / ED DIAGNOSES   Final diagnoses:  Torticollis  Hypertension, uncontrolled  Medically noncompliant     Rx / DC Orders   ED Discharge Orders          Ordered    methocarbamol (ROBAXIN) 500 MG tablet        08/11/22 1011    ibuprofen (ADVIL) 800 MG tablet  Every 8 hours PRN        08/11/22 1011    HYDROcodone-acetaminophen (NORCO/VICODIN) 5-325 MG tablet  Every 6 hours PRN        08/11/22 1011    amLODipine (NORVASC) 5 MG tablet  Daily        08/11/22 1140             Note:  This document was prepared using Dragon voice recognition software and may include unintentional dictation errors.   Tommi Rumps, PA-C 08/11/22 1215    Chesley Noon, MD 08/16/22 [phone]

## 2022-08-11 NOTE — ED Notes (Signed)
Provider aware of patients BP and instructed patient to follow up with PCP.

## 2022-08-11 NOTE — ED Triage Notes (Signed)
C/o posterior neck pain since Thursday. Denies injury, states "I must have slept wrong or something".

## 2022-08-15 DIAGNOSIS — R079 Chest pain, unspecified: Secondary | ICD-10-CM | POA: Diagnosis not present

## 2022-08-15 DIAGNOSIS — I10 Essential (primary) hypertension: Secondary | ICD-10-CM | POA: Diagnosis not present

## 2022-08-15 DIAGNOSIS — F17210 Nicotine dependence, cigarettes, uncomplicated: Secondary | ICD-10-CM | POA: Diagnosis not present

## 2022-08-15 DIAGNOSIS — J9811 Atelectasis: Secondary | ICD-10-CM | POA: Diagnosis not present

## 2022-08-15 DIAGNOSIS — M542 Cervicalgia: Secondary | ICD-10-CM | POA: Diagnosis not present

## 2022-08-15 DIAGNOSIS — R0989 Other specified symptoms and signs involving the circulatory and respiratory systems: Secondary | ICD-10-CM | POA: Diagnosis not present

## 2022-08-16 DIAGNOSIS — M542 Cervicalgia: Secondary | ICD-10-CM | POA: Diagnosis not present

## 2022-08-25 DIAGNOSIS — I10 Essential (primary) hypertension: Secondary | ICD-10-CM | POA: Diagnosis not present

## 2022-08-25 DIAGNOSIS — F17210 Nicotine dependence, cigarettes, uncomplicated: Secondary | ICD-10-CM | POA: Diagnosis not present

## 2022-08-25 DIAGNOSIS — M542 Cervicalgia: Secondary | ICD-10-CM | POA: Diagnosis not present

## 2022-08-25 DIAGNOSIS — M4722 Other spondylosis with radiculopathy, cervical region: Secondary | ICD-10-CM | POA: Diagnosis not present

## 2022-09-10 DIAGNOSIS — M542 Cervicalgia: Secondary | ICD-10-CM | POA: Diagnosis not present

## 2022-09-15 DIAGNOSIS — M542 Cervicalgia: Secondary | ICD-10-CM | POA: Diagnosis not present

## 2022-09-22 DIAGNOSIS — M542 Cervicalgia: Secondary | ICD-10-CM | POA: Diagnosis not present

## 2022-10-13 DIAGNOSIS — M542 Cervicalgia: Secondary | ICD-10-CM | POA: Diagnosis not present

## 2022-10-28 DIAGNOSIS — R29898 Other symptoms and signs involving the musculoskeletal system: Secondary | ICD-10-CM | POA: Diagnosis not present

## 2022-10-28 DIAGNOSIS — Z79899 Other long term (current) drug therapy: Secondary | ICD-10-CM | POA: Diagnosis not present

## 2022-10-28 DIAGNOSIS — F17210 Nicotine dependence, cigarettes, uncomplicated: Secondary | ICD-10-CM | POA: Diagnosis not present

## 2022-10-28 DIAGNOSIS — I10 Essential (primary) hypertension: Secondary | ICD-10-CM | POA: Diagnosis not present

## 2022-10-28 DIAGNOSIS — R200 Anesthesia of skin: Secondary | ICD-10-CM | POA: Diagnosis not present

## 2022-10-28 DIAGNOSIS — R531 Weakness: Secondary | ICD-10-CM | POA: Diagnosis not present

## 2022-10-28 DIAGNOSIS — M5412 Radiculopathy, cervical region: Secondary | ICD-10-CM | POA: Diagnosis not present

## 2022-10-29 DIAGNOSIS — R200 Anesthesia of skin: Secondary | ICD-10-CM | POA: Diagnosis not present

## 2022-10-29 DIAGNOSIS — M25532 Pain in left wrist: Secondary | ICD-10-CM | POA: Diagnosis not present

## 2022-10-29 DIAGNOSIS — Z981 Arthrodesis status: Secondary | ICD-10-CM | POA: Diagnosis not present

## 2022-10-29 DIAGNOSIS — M79604 Pain in right leg: Secondary | ICD-10-CM | POA: Diagnosis not present

## 2022-10-29 DIAGNOSIS — R202 Paresthesia of skin: Secondary | ICD-10-CM | POA: Diagnosis not present

## 2022-10-29 DIAGNOSIS — M542 Cervicalgia: Secondary | ICD-10-CM | POA: Diagnosis not present

## 2022-10-29 DIAGNOSIS — E669 Obesity, unspecified: Secondary | ICD-10-CM | POA: Diagnosis not present

## 2022-10-29 DIAGNOSIS — M7989 Other specified soft tissue disorders: Secondary | ICD-10-CM | POA: Diagnosis not present

## 2022-10-29 DIAGNOSIS — M6281 Muscle weakness (generalized): Secondary | ICD-10-CM | POA: Diagnosis not present

## 2022-11-20 DIAGNOSIS — M5002 Cervical disc disorder with myelopathy, mid-cervical region, unspecified level: Secondary | ICD-10-CM | POA: Diagnosis not present

## 2022-11-20 DIAGNOSIS — M47812 Spondylosis without myelopathy or radiculopathy, cervical region: Secondary | ICD-10-CM | POA: Diagnosis not present

## 2022-11-20 DIAGNOSIS — M4312 Spondylolisthesis, cervical region: Secondary | ICD-10-CM | POA: Diagnosis not present

## 2022-11-20 DIAGNOSIS — M542 Cervicalgia: Secondary | ICD-10-CM | POA: Diagnosis not present

## 2023-01-01 DIAGNOSIS — G959 Disease of spinal cord, unspecified: Secondary | ICD-10-CM | POA: Diagnosis not present

## 2023-01-01 DIAGNOSIS — M4802 Spinal stenosis, cervical region: Secondary | ICD-10-CM | POA: Diagnosis not present

## 2023-01-01 DIAGNOSIS — M5002 Cervical disc disorder with myelopathy, mid-cervical region, unspecified level: Secondary | ICD-10-CM | POA: Diagnosis not present

## 2023-01-01 DIAGNOSIS — M25512 Pain in left shoulder: Secondary | ICD-10-CM | POA: Diagnosis not present

## 2023-01-01 DIAGNOSIS — M542 Cervicalgia: Secondary | ICD-10-CM | POA: Diagnosis not present

## 2023-01-01 DIAGNOSIS — M79602 Pain in left arm: Secondary | ICD-10-CM | POA: Diagnosis not present
